# Patient Record
Sex: Female | Born: 1935 | Race: White | Hispanic: No | Marital: Married | Smoking: Former smoker
Health system: Southern US, Community
[De-identification: ages and names within clinical notes are randomized; demographics above are authoritative.]

## PROBLEM LIST (undated history)

## (undated) DIAGNOSIS — I10 Essential (primary) hypertension: Secondary | ICD-10-CM

## (undated) HISTORY — PX: TONSILLECTOMY: SUR1361

## (undated) HISTORY — PX: APPENDECTOMY: SHX54

## (undated) HISTORY — PX: KNEE SURGERY: SHX244

---

## 2000-08-20 ENCOUNTER — Encounter: Payer: Self-pay | Admitting: Family Medicine

## 2000-08-20 ENCOUNTER — Encounter: Admission: RE | Admit: 2000-08-20 | Discharge: 2000-08-20 | Payer: Self-pay | Admitting: Family Medicine

## 2000-08-29 ENCOUNTER — Encounter: Admission: RE | Admit: 2000-08-29 | Discharge: 2000-08-29 | Payer: Self-pay | Admitting: Family Medicine

## 2000-08-29 ENCOUNTER — Encounter: Payer: Self-pay | Admitting: Family Medicine

## 2001-07-01 ENCOUNTER — Emergency Department (HOSPITAL_COMMUNITY): Admission: EM | Admit: 2001-07-01 | Discharge: 2001-07-01 | Payer: Self-pay | Admitting: Emergency Medicine

## 2002-02-23 ENCOUNTER — Ambulatory Visit (HOSPITAL_COMMUNITY): Admission: RE | Admit: 2002-02-23 | Discharge: 2002-02-23 | Payer: Self-pay | Admitting: Cardiology

## 2004-08-25 ENCOUNTER — Ambulatory Visit (HOSPITAL_COMMUNITY): Admission: RE | Admit: 2004-08-25 | Discharge: 2004-08-25 | Payer: Self-pay | Admitting: Family Medicine

## 2005-09-25 ENCOUNTER — Emergency Department (HOSPITAL_COMMUNITY): Admission: EM | Admit: 2005-09-25 | Discharge: 2005-09-25 | Payer: Self-pay | Admitting: Emergency Medicine

## 2010-06-13 ENCOUNTER — Encounter (HOSPITAL_COMMUNITY): Payer: Self-pay

## 2010-06-14 ENCOUNTER — Other Ambulatory Visit: Payer: Self-pay | Admitting: Family Medicine

## 2010-06-14 ENCOUNTER — Ambulatory Visit (HOSPITAL_COMMUNITY): Payer: Medicare Other | Attending: Family Medicine

## 2010-06-14 DIAGNOSIS — D649 Anemia, unspecified: Secondary | ICD-10-CM | POA: Insufficient documentation

## 2010-06-15 LAB — CROSSMATCH: Unit division: 0

## 2011-09-13 ENCOUNTER — Ambulatory Visit: Payer: Medicare Other | Attending: Orthopedic Surgery | Admitting: Physical Therapy

## 2011-09-13 DIAGNOSIS — M25619 Stiffness of unspecified shoulder, not elsewhere classified: Secondary | ICD-10-CM | POA: Insufficient documentation

## 2011-09-13 DIAGNOSIS — IMO0001 Reserved for inherently not codable concepts without codable children: Secondary | ICD-10-CM | POA: Insufficient documentation

## 2011-09-13 DIAGNOSIS — M25519 Pain in unspecified shoulder: Secondary | ICD-10-CM | POA: Insufficient documentation

## 2011-09-18 ENCOUNTER — Ambulatory Visit: Payer: Medicare Other | Admitting: Physical Therapy

## 2011-09-20 ENCOUNTER — Ambulatory Visit: Payer: Medicare Other | Admitting: Physical Therapy

## 2011-09-25 ENCOUNTER — Ambulatory Visit: Payer: Medicare Other | Admitting: Physical Therapy

## 2011-09-27 ENCOUNTER — Ambulatory Visit: Payer: Medicare Other | Admitting: Physical Therapy

## 2011-10-02 ENCOUNTER — Encounter: Payer: Medicare Other | Admitting: Physical Therapy

## 2011-10-04 ENCOUNTER — Encounter: Payer: Medicare Other | Admitting: Physical Therapy

## 2011-10-09 ENCOUNTER — Encounter: Payer: Medicare Other | Admitting: Physical Therapy

## 2011-10-11 ENCOUNTER — Encounter: Payer: Medicare Other | Admitting: Physical Therapy

## 2011-11-22 ENCOUNTER — Observation Stay (HOSPITAL_COMMUNITY)
Admission: EM | Admit: 2011-11-22 | Discharge: 2011-11-23 | Disposition: A | Discharge status: Home / Self Care | Payer: Medicare Other | Attending: Internal Medicine | Admitting: Internal Medicine

## 2011-11-22 ENCOUNTER — Emergency Department (HOSPITAL_COMMUNITY): Payer: Medicare Other

## 2011-11-22 ENCOUNTER — Encounter (HOSPITAL_COMMUNITY): Payer: Self-pay | Admitting: *Deleted

## 2011-11-22 DIAGNOSIS — K5289 Other specified noninfective gastroenteritis and colitis: Principal | ICD-10-CM | POA: Insufficient documentation

## 2011-11-22 DIAGNOSIS — K529 Noninfective gastroenteritis and colitis, unspecified: Secondary | ICD-10-CM

## 2011-11-22 DIAGNOSIS — I10 Essential (primary) hypertension: Secondary | ICD-10-CM

## 2011-11-22 DIAGNOSIS — K922 Gastrointestinal hemorrhage, unspecified: Secondary | ICD-10-CM

## 2011-11-22 DIAGNOSIS — R109 Unspecified abdominal pain: Secondary | ICD-10-CM

## 2011-11-22 HISTORY — DX: Essential (primary) hypertension: I10

## 2011-11-22 LAB — BASIC METABOLIC PANEL
BUN: 17 mg/dL (ref 6–23)
CO2: 22 mEq/L (ref 19–32)
Chloride: 102 mEq/L (ref 96–112)
Glucose, Bld: 129 mg/dL — ABNORMAL HIGH (ref 70–99)
Potassium: 4.4 mEq/L (ref 3.5–5.1)
Sodium: 135 mEq/L (ref 135–145)

## 2011-11-22 LAB — CBC
HCT: 35.5 % — ABNORMAL LOW (ref 36.0–46.0)
Hemoglobin: 12 g/dL (ref 12.0–15.0)
MCHC: 33.8 g/dL (ref 30.0–36.0)
RBC: 4.07 MIL/uL (ref 3.87–5.11)
WBC: 11.9 10*3/uL — ABNORMAL HIGH (ref 4.0–10.5)

## 2011-11-22 LAB — CBC WITH DIFFERENTIAL/PLATELET
Eosinophils Absolute: 0.1 10*3/uL (ref 0.0–0.7)
Hemoglobin: 11.8 g/dL — ABNORMAL LOW (ref 12.0–15.0)
Lymphocytes Relative: 11 % — ABNORMAL LOW (ref 12–46)
Lymphs Abs: 1.1 10*3/uL (ref 0.7–4.0)
MCH: 29.5 pg (ref 26.0–34.0)
MCV: 88.3 fL (ref 78.0–100.0)
Monocytes Relative: 8 % (ref 3–12)
Neutrophils Relative %: 81 % — ABNORMAL HIGH (ref 43–77)
Platelets: 305 10*3/uL (ref 150–400)
RBC: 4 MIL/uL (ref 3.87–5.11)
WBC: 10.5 10*3/uL (ref 4.0–10.5)

## 2011-11-22 LAB — OCCULT BLOOD, POC DEVICE: Fecal Occult Bld: POSITIVE

## 2011-11-22 LAB — PROTIME-INR: INR: 1.1 (ref 0.00–1.49)

## 2011-11-22 LAB — APTT: aPTT: 29 seconds (ref 24–37)

## 2011-11-22 LAB — CREATININE, SERUM
Creatinine, Ser: 0.45 mg/dL — ABNORMAL LOW (ref 0.50–1.10)
GFR calc Af Amer: >90 mL/min (ref >90)
GFR calc non Af Amer: 88 mL/min — ABNORMAL LOW (ref >90)

## 2011-11-22 MED ORDER — AMLODIPINE BESYLATE 10 MG PO TABS
10.0000 mg | ORAL_TABLET | Freq: Every day | ORAL | Status: DC
  Administered 2011-11-23: 10 mg via ORAL
  Filled 2011-11-22: qty 1

## 2011-11-22 MED ORDER — AMLODIPINE-OLMESARTAN 10-40 MG PO TABS: 1.0000 | ORAL_TABLET | Freq: Every day | ORAL | Status: DC

## 2011-11-22 MED ORDER — FAMOTIDINE 10 MG PO TABS
10.0000 mg | ORAL_TABLET | Freq: Two times a day (BID) | ORAL | Status: DC
  Administered 2011-11-22 – 2011-11-23 (×2): 10 mg via ORAL
  Filled 2011-11-22 (×3): qty 1

## 2011-11-22 MED ORDER — ONDANSETRON HCL 4 MG/2ML IJ SOLN: 4.0000 mg | Freq: Four times a day (QID) | INTRAMUSCULAR | Status: DC | PRN

## 2011-11-22 MED ORDER — IOHEXOL 300 MG/ML  SOLN
80.0000 mL | Freq: Once | INTRAMUSCULAR | Status: AC | PRN
  Administered 2011-11-22: 80 mL via INTRAVENOUS

## 2011-11-22 MED ORDER — ACETAMINOPHEN 325 MG PO TABS: 650.0000 mg | ORAL_TABLET | Freq: Four times a day (QID) | ORAL | Status: DC | PRN

## 2011-11-22 MED ORDER — OXYCODONE HCL 5 MG PO TABS: 5.0000 mg | ORAL_TABLET | ORAL | Status: DC | PRN

## 2011-11-22 MED ORDER — IOHEXOL 300 MG/ML  SOLN
20.0000 mL | INTRAMUSCULAR | Status: AC
  Administered 2011-11-22 (×2): 20 mL via ORAL

## 2011-11-22 MED ORDER — LORATADINE 10 MG PO TABS
10.0000 mg | ORAL_TABLET | Freq: Every day | ORAL | Status: DC | PRN
  Filled 2011-11-22: qty 1

## 2011-11-22 MED ORDER — VITAMIN D3 25 MCG (1000 UNIT) PO TABS
1000.0000 [IU] | ORAL_TABLET | Freq: Every day | ORAL | Status: DC
  Administered 2011-11-23: 1000 [IU] via ORAL
  Filled 2011-11-22: qty 1

## 2011-11-22 MED ORDER — ACETAMINOPHEN 650 MG RE SUPP: 650.0000 mg | Freq: Four times a day (QID) | RECTAL | Status: DC | PRN

## 2011-11-22 MED ORDER — MORPHINE SULFATE 2 MG/ML IJ SOLN: 1.0000 mg | INTRAMUSCULAR | Status: DC | PRN

## 2011-11-22 MED ORDER — LORAZEPAM 0.5 MG PO TABS
0.5000 mg | ORAL_TABLET | Freq: Every day | ORAL | Status: DC
  Administered 2011-11-22: 0.5 mg via ORAL
  Filled 2011-11-22: qty 1

## 2011-11-22 MED ORDER — IRBESARTAN 300 MG PO TABS
300.0000 mg | ORAL_TABLET | Freq: Every day | ORAL | Status: DC
Start: 2011-11-23 — End: 2011-11-23
  Administered 2011-11-23: 300 mg via ORAL
  Filled 2011-11-22: qty 1

## 2011-11-22 MED ORDER — ENOXAPARIN SODIUM 40 MG/0.4ML ~~LOC~~ SOLN
40.0000 mg | SUBCUTANEOUS | Status: DC
  Administered 2011-11-22: 40 mg via SUBCUTANEOUS
  Filled 2011-11-22 (×2): qty 0.4

## 2011-11-22 MED ORDER — SODIUM CHLORIDE 0.9 % IV SOLN
INTRAVENOUS | Status: DC
  Administered 2011-11-22: 22:00:00 via INTRAVENOUS

## 2011-11-22 MED ORDER — ONDANSETRON HCL 4 MG PO TABS: 4.0000 mg | ORAL_TABLET | Freq: Four times a day (QID) | ORAL | Status: DC | PRN

## 2011-11-22 NOTE — ED Provider Notes (Signed)
History     CSN: [id number]  Arrival date & time 11/22/11  4098   First MD Initiated Contact with Patient 11/22/11 1003      Chief Complaint  Patient presents with  . Rectal Bleeding  . Abdominal Pain    (Consider location/radiation/quality/duration/timing/severity/associated sxs/prior treatment) HPI Comments: Kimberly Quinn presents ambulatory with her husband for evaluation.  She states she developed lower abdominal cramping and had several loose stools yesterday morning and afternoon while returning home from [facility].  Her husband denies abdominal discomfort, nausea, vomiting, and diarrhea.  They have eaten in the same places and shared the same meals while away on vacation.  She had several small loose BMs in the early evening also and noticed some bright red blood in the toilet and on the tissue.  Today she felt fine initially but then developed cramping and had 3 BMs that were frankly bloody.  She takes an 81 mg aspirin every 2 days.  She denies taking any other anticoagulants.    Patient is a 78 y.o. female presenting with hematochezia and abdominal pain. The history is provided by the patient. No language interpreter was used.  Rectal Bleeding  The current episode started yesterday. The problem occurs occasionally. The problem has been gradually worsening. The pain is moderate. The stool is described as liquid, bloody and mixed with blood. There was no prior unsuccessful therapy. Associated symptoms include abdominal pain, diarrhea and nausea. Pertinent negatives include no anorexia, no fever, no hematemesis, no hemorrhoids, no rectal pain, no vomiting, no hematuria, no vaginal bleeding, no vaginal discharge, no chest pain, no headaches, no coughing, no difficulty breathing and no rash. She has been behaving normally. She has been eating and drinking normally. Urine output has been normal. The last void occurred less than 6 hours ago. Her past medical history does not include abdominal  surgery, developmental delay, inflammatory bowel disease, recent abdominal injury, recent antibiotic use, recent change in diet or a recent illness. Past medical history comments: diverticulosis. There were no sick contacts. Recently, medical care has been given by the PCP (treated for a UTI with cipro (was on bactrim but developed arash and the med was stopped)).  Abdominal Pain The primary symptoms of the illness include abdominal pain, fatigue, nausea, diarrhea and hematochezia. The primary symptoms of the illness do not include fever, shortness of breath, vomiting, hematemesis, vaginal discharge or vaginal bleeding.  Symptoms associated with the illness do not include chills, anorexia, diaphoresis, constipation or hematuria. Significant associated medical issues do not include inflammatory bowel disease.    Past Medical History  Diagnosis Date  . Hypertension     History reviewed. No pertinent past surgical history.  History reviewed. No pertinent family history.  History  Substance Use Topics  . Smoking status: Former Smoker    Quit date: 01/01/1958  . Smokeless tobacco: Not on file  . Alcohol Use: No    OB History    Grav Para Term Preterm Abortions TAB SAB Ect Mult Living                  Review of Systems  Constitutional: Positive for fatigue. Negative for fever, chills, diaphoresis, activity change and appetite change.  HENT: Negative.   Eyes: Negative.   Respiratory: Negative for cough, chest tightness, shortness of breath and wheezing.   Cardiovascular: Negative for chest pain.  Gastrointestinal: Positive for nausea, abdominal pain, diarrhea, blood in stool, hematochezia and anal bleeding. Negative for vomiting, constipation, abdominal distention, rectal pain,  anorexia, hematemesis and hemorrhoids.  Genitourinary: Negative for hematuria, vaginal bleeding and vaginal discharge.  Musculoskeletal: Negative.   Skin: Negative for pallor and rash.  Neurological: Positive  for light-headedness. Negative for dizziness, seizures, syncope, facial asymmetry, weakness and headaches.  Psychiatric/Behavioral: Negative.     Allergies  Sulfamethoxazole-trimethoprim  Home Medications   Current Outpatient Rx  Name  Route  Sig  Dispense  Refill  . AMLODIPINE-OLMESARTAN 10-40 MG PO TABS   Oral   Take 1 tablet by mouth daily.         . ASPIRIN EC 81 MG PO TBEC   Oral   Take 81 mg by mouth every other day.         Marland Kitchen VITAMIN D 1000 UNITS PO TABS   Oral   Take 1,000 Units by mouth daily.         Marland Kitchen CIPROFLOXACIN HCL 500 MG PO TABS   Oral   Take 500 mg by mouth 2 (two) times daily. Starting 11/16/11 for 3 days         . LORATADINE 10 MG PO TABS   Oral   Take 10 mg by mouth daily as needed. For allergies         . LORAZEPAM 0.5 MG PO TABS   Oral   Take 0.5 mg by mouth at bedtime.         Marland Kitchen FISH OIL 1200 MG PO CAPS   Oral   Take 1,200 mg by mouth 2 (two) times daily.         Marland Kitchen RANITIDINE HCL 150 MG PO CAPS   Oral   Take 150 mg by mouth 2 (two) times daily.         . SULFAMETHOXAZOLE-TRIMETHOPRIM 400-80 MG PO TABS   Oral   Take 1 tablet by mouth 2 (two) times daily. Starting 11/13/11 for 3 days           BP 109/70  Pulse 75  Temp 97.9 F (36.6 C) (Oral)  Resp 18  SpO2 98%  Physical Exam  Nursing note and vitals reviewed. Constitutional: She appears well-developed and well-nourished. No distress.  HENT:  Head: Normocephalic.  Right Ear: External ear normal.  Left Ear: External ear normal.  Nose: Nose normal.  Mouth/Throat: Oropharynx is clear and moist. No oropharyngeal exudate.  Eyes: Conjunctivae normal are normal. Pupils are equal, round, and reactive to light. Right eye exhibits no discharge. Left eye exhibits no discharge. No scleral icterus.  Neck: Normal range of motion. Neck supple. No JVD present. No tracheal deviation present.  Cardiovascular: Normal rate, regular rhythm, normal heart sounds and intact distal  pulses.  Exam reveals no gallop and no friction rub.   No murmur heard. Pulmonary/Chest: Effort normal and breath sounds normal. No stridor. No respiratory distress. She has no wheezes. She has no rales. She exhibits no tenderness.  Abdominal: Soft. Normal appearance. She exhibits no shifting dullness, no distension, no pulsatile liver, no abdominal bruit, no ascites, no pulsatile midline mass and no mass. Bowel sounds are increased. There is no hepatosplenomegaly. There is generalized tenderness (mostly lower abd without localizing). There is no rigidity, no rebound, no guarding, no CVA tenderness, no tenderness at McBurney's point and negative Murphy's sign. No hernia.  Musculoskeletal: Normal range of motion. She exhibits no edema and no tenderness.  Lymphadenopathy:    She has no cervical adenopathy.  Neurological: She is alert. No cranial nerve deficit.  Skin: Skin is warm and dry. No rash noted. No  erythema. No pallor.  Psychiatric: She has a normal mood and affect. Her behavior is normal.    ED Course  Procedures (including critical care time)  Labs Reviewed  CBC WITH DIFFERENTIAL - Abnormal; Notable for the following:    Hemoglobin 11.8 (*)     HCT 35.3 (*)     Neutrophils Relative 81 (*)     Neutro Abs 8.5 (*)     Lymphocytes Relative 11 (*)     All other components within normal limits  BASIC METABOLIC PANEL - Abnormal; Notable for the following:    Glucose, Bld 129 (*)     GFR calc non Af Amer 85 (*)     All other components within normal limits  PROTIME-INR  APTT  OCCULT BLOOD, POC DEVICE   No results found.   No diagnosis found.    MDM  Pt presents for evaluation of abdominal cramping and bloody stools.  She currently appears comfortable, NAD.  Note mild lower abd tenderness on exam without evidence of peritonitis.  Noted gross blood with occult blood positive stool.  No obvious fissures, rectal tenderness, or bleeding hemorrhoids encountered.  Will obtain basic  labs, coags, and a CT scan of the abdomen.  If she remains stable and no process is encountered on the imaging that would necessitate an acute surgical intervention, will contact her gastroenterologist for further evaluation.  CT scan demonstrates acute colitis.  With the GI bleeding, abdominal cramping, and colitis, I am concerned this is a hemorrhagic colitis.  She has been taking bactrim and ciprofloxacin.  Plan admit for further mgmnt.  Will discuss options for antibiotic coverage with the admitting provider.      Tobin Chad, MD 11/22/11 1736

## 2011-11-22 NOTE — ED Notes (Signed)
Received pt from ED to CDU 3.  Pt ambulatory to bathroom on arrival - voided and tolerated well.  Pt reports intermittent lower, bilateral abdominal pain (denies any at present) and 3 episodes of rectal bleeding.  Pt denies any rectal pain, reports last episode was bright red blood.  Pt denies any nausea or vomiting.  Pt is alert, oriented.  Pt had episode of tachycardia during evaluation by RN with heart rate increasing to 150 x 10 seconds, returning to NSR in 70's.  Pt was asymptomatic.

## 2011-11-22 NOTE — ED Provider Notes (Signed)
2:36 PM Patient moved to CDU holding for CT abd/pelvis, GI consult vs admission.  Sign out received from Dr Lorenso Courier.  Pt with hx diverticulosis p/w BRBPR and crampy lower abdominal pain.  Pt is mildly anemic (Hgb 11.8).  Plan is for CT abd/pelvis, r/o diverticulitis.  If CT is negative, pt will need to be seen by GI in the ED (her GI doctor is Buccini).  If she has any further bleeding, an H&H is to be drawn.  Pt may need to be admitted to medicine for observation given results or any further bleeding. Nurse Tana Coast witnessed an approximate 10 second episode of tachycardia to the 150s.  Pt was completely asymptomatic at the time.  Rhythm strip printed and discussed with Dr Lorenso Courier.  If pt repeats this, will attempt to get EKG.  Pt is is normal sinus now, remains without chest pain or SOB.  Pt reports she has had no bleeding since early this morning.  States she continues to have some pressure in her lower abdomen.  Pt is A&O, NAD, RRR, CTAB, abd soft, very mild tenderness across lower abdomen.   Plan:  CT, repeat H&H if further bleeding, EKG if repeat tachycardia, GI consult, possible admission pending rest of workup and patient reassessment.    3:05 PM Patient signed out to Felicie Morn, NP, who assumes care of patient at change of shift.  Pt is currently in CT.    Filed Vitals:   11/22/11 1411  BP: 177/65  Pulse: 83  Temp: 97.5 F (36.4 C)  Resp: 17    Results for orders placed during the hospital encounter of 11/22/11  CBC WITH DIFFERENTIAL      Component Value Range   WBC 10.5  4.0 - 10.5 K/uL   RBC 4.00  3.87 - 5.11 MIL/uL   Hemoglobin 11.8 (*) 12.0 - 15.0 g/dL   HCT 29.5 (*) 28.4 - 13.2 %   MCV 88.3  78.0 - 100.0 fL   MCH 29.5  26.0 - 34.0 pg   MCHC 33.4  30.0 - 36.0 g/dL   RDW 44.0  10.2 - 72.5 %   Platelets 305  150 - 400 K/uL   Neutrophils Relative 81 (*) 43 - 77 %   Neutro Abs 8.5 (*) 1.7 - 7.7 K/uL   Lymphocytes Relative 11 (*) 12 - 46 %   Lymphs Abs 1.1  0.7 - 4.0 K/uL   Monocytes Relative 8  3 - 12 %   Monocytes Absolute 0.8  0.1 - 1.0 K/uL   Eosinophils Relative 1  0 - 5 %   Eosinophils Absolute 0.1  0.0 - 0.7 K/uL   Basophils Relative 0  0 - 1 %   Basophils Absolute 0.0  0.0 - 0.1 K/uL  BASIC METABOLIC PANEL      Component Value Range   Sodium 135  135 - 145 mEq/L   Potassium 4.4  3.5 - 5.1 mEq/L   Chloride 102  96 - 112 mEq/L   CO2 22  19 - 32 mEq/L   Glucose, Bld 129 (*) 70 - 99 mg/dL   BUN 17  6 - 23 mg/dL   Creatinine, Ser 3.66  0.50 - 1.10 mg/dL   Calcium 44.0  8.4 - 34.7 mg/dL   GFR calc non Af Amer 85 (*) >90 mL/min   GFR calc Af Amer >90  >90 mL/min  PROTIME-INR      Component Value Range   Prothrombin Time 14.1  11.6 - 15.2  seconds   INR 1.10  0.00 - 1.49  APTT      Component Value Range   aPTT 29  24 - 37 seconds  OCCULT BLOOD, POC DEVICE      Component Value Range   Fecal Occult Bld POSITIVE     No results found.    [place], [place] 11/22/11 1507

## 2011-11-22 NOTE — H&P (Signed)
[facility]          History and Physical    PCP:   Lupita Raider, MD   Chief Complaint:  Abdominal pain and bloody diarrhea  HPI: 78 y/o woman with no PMH other than HTN, who presents to the ED with the above complaint at the urging of her GI, Dr. Matthias Hughs. She had a UTI 1 week ago and completed a 3 days course of cipro on Monday. Monday she and her husband went to [facility]. Yesterday, she developed some crampy abdominal pain and felt like she was going to pass out due to the severity of the pain. She then had diarrhea. She had another episode before leaving [facility] yesterday at that time she noticed a little blood when she wiped. This morning she had another episode of crampy abdominal pain and went to the bathroom, when she stood up from the toilet she noticed the water was red with all the blood. She became concerned and called Dr. Matthias Hughs who recommended she come to the ED for evaluation. She has had one more episode in the hospital. She is hemodynamically stable, Hb is 11.8. A CT scan shows colitis. EDP has discussed with Dr. Matthias Hughs who recommends admission for stabilization, IVF and pain control. He does not believe she needs antibiotics at this time. We have been asked to admit her for further evaluation and management.  Allergies:   Allergies  Allergen Reactions  . Sulfamethoxazole-Trimethoprim Rash      Past Medical History  Diagnosis Date  . Hypertension     History reviewed. No pertinent past surgical history.  Prior to Admission medications   Medication Sig Start Date End Date Taking? Authorizing Provider  amLODipine-olmesartan (AZOR) 10-40 MG per tablet Take 1 tablet by mouth daily.   Yes Historical Provider, MD  aspirin EC 81 MG tablet Take 81 mg by mouth every other day.   Yes Historical Provider, MD  cholecalciferol (VITAMIN D) 1000 UNITS tablet Take 1,000 Units by mouth daily.   Yes Historical Provider, MD  ciprofloxacin (CIPRO) 500 MG  tablet Take 500 mg by mouth 2 (two) times daily. Starting 11/16/11 for 3 days   Yes Historical Provider, MD  loratadine (CLARITIN) 10 MG tablet Take 10 mg by mouth daily as needed. For allergies   Yes Historical Provider, MD  LORazepam (ATIVAN) 0.5 MG tablet Take 0.5 mg by mouth at bedtime.   Yes Historical Provider, MD  Omega-3 Fatty Acids (FISH OIL) 1200 MG CAPS Take 1,200 mg by mouth 2 (two) times daily.   Yes Historical Provider, MD  ranitidine (ZANTAC) 150 MG capsule Take 150 mg by mouth 2 (two) times daily.   Yes Historical Provider, MD  sulfamethoxazole-trimethoprim (BACTRIM,SEPTRA) 400-80 MG per tablet Take 1 tablet by mouth 2 (two) times daily. Starting 11/13/11 for 3 days   Yes Historical Provider, MD    Social History:  reports that she quit smoking about 53 years ago. She does not have any smokeless tobacco history on file. She reports that she does not drink alcohol or use illicit drugs.  History reviewed. No pertinent family history.  Review of Systems:  Constitutional: Denies fever, chills, diaphoresis, appetite change and fatigue.  HEENT: Denies photophobia, eye pain, redness, hearing loss, ear pain, congestion, sore throat, rhinorrhea, sneezing, mouth sores, trouble swallowing, neck pain, neck stiffness and tinnitus.   Respiratory: Denies SOB, DOE, cough, chest tightness,  and wheezing.   Cardiovascular: Denies chest pain, palpitations and leg swelling.  Gastrointestinal: Denies nausea,  vomiting,  Constipation. Genitourinary: Denies dysuria, urgency, frequency, hematuria, flank pain and difficulty urinating.  Musculoskeletal: Denies myalgias, back pain, joint swelling, arthralgias and gait problem.  Skin: Denies pallor, rash and wound.  Neurological: Denies dizziness, seizures, syncope, weakness, light-headedness, numbness and headaches.  Hematological: Denies adenopathy. Easy bruising, personal or family bleeding history  Psychiatric/Behavioral: Denies suicidal ideation,  mood changes, confusion, nervousness, sleep disturbance and agitation   Physical Exam: Blood pressure 162/73, pulse 83, temperature 97.5 F (36.4 C), temperature source Rectal, resp. rate 16, SpO2 98.00%. Gen: AA Ox3, NAD HEENT: [place], AT, PERRL, EOMI Neck: supple, no JVD, no LAD, no bruits, no goiter. CV: RRR, no M/R/G Lungs: CTA B Abd: S, NT, ND, +BS, no masses or organomegaly noted. Ext: no C/C/E, +pedal pulses Neuro: grossly intact and non-focal. Skin: no rashes or skin breakdown identified.  Labs on Admission:  Results for orders placed during the hospital encounter of 11/22/11 (from the past 48 hour(s))  CBC WITH DIFFERENTIAL     Status: Abnormal   Collection Time   11/22/11  8:55 AM      Component Value Range Comment   WBC 10.5  4.0 - 10.5 K/uL    RBC 4.00  3.87 - 5.11 MIL/uL    Hemoglobin 11.8 (*) 12.0 - 15.0 g/dL    HCT 16.1 (*) 09.6 - 46.0 %    MCV 88.3  78.0 - 100.0 fL    MCH 29.5  26.0 - 34.0 pg    MCHC 33.4  30.0 - 36.0 g/dL    RDW 04.5  40.9 - 81.1 %    Platelets 305  150 - 400 K/uL    Neutrophils Relative 81 (*) 43 - 77 %    Neutro Abs 8.5 (*) 1.7 - 7.7 K/uL    Lymphocytes Relative 11 (*) 12 - 46 %    Lymphs Abs 1.1  0.7 - 4.0 K/uL    Monocytes Relative 8  3 - 12 %    Monocytes Absolute 0.8  0.1 - 1.0 K/uL    Eosinophils Relative 1  0 - 5 %    Eosinophils Absolute 0.1  0.0 - 0.7 K/uL    Basophils Relative 0  0 - 1 %    Basophils Absolute 0.0  0.0 - 0.1 K/uL   BASIC METABOLIC PANEL     Status: Abnormal   Collection Time   11/22/11  8:55 AM      Component Value Range Comment   Sodium 135  135 - 145 mEq/L    Potassium 4.4  3.5 - 5.1 mEq/L    Chloride 102  96 - 112 mEq/L    CO2 22  19 - 32 mEq/L    Glucose, Bld 129 (*) 70 - 99 mg/dL    BUN 17  6 - 23 mg/dL    Creatinine, Ser 9.14  0.50 - 1.10 mg/dL    Calcium 78.2  8.4 - 10.5 mg/dL    GFR calc non Af Amer 85 (*) >90 mL/min    GFR calc Af Amer >90  >90 mL/min   PROTIME-INR     Status: Normal   Collection  Time   11/22/11 11:28 AM      Component Value Range Comment   Prothrombin Time 14.1  11.6 - 15.2 seconds    INR 1.10  0.00 - 1.49   APTT     Status: Normal   Collection Time   11/22/11 11:28 AM      Component Value Range Comment  aPTT 29  24 - 37 seconds   OCCULT BLOOD, POC DEVICE     Status: Normal   Collection Time   11/22/11  1:29 PM      Component Value Range Comment   Fecal Occult Bld POSITIVE       Radiological Exams on Admission: Ct Abdomen Pelvis W Contrast  11/22/2011  *RADIOLOGY REPORT*  Clinical Data: 78 year old female with intermittent bilateral lower abdominal pain.  Episodes of rectal bleeding.  Bright red blood.  CT ABDOMEN AND PELVIS WITH CONTRAST  Technique:  Multidetector CT imaging of the abdomen and pelvis was performed following the standard protocol during bolus administration of intravenous contrast.  Contrast: 80mL OMNIPAQUE IOHEXOL 300 MG/ML  SOLN  Comparison: None.  Findings: Lung bases are clear.  Cardiomegaly.  No pleural pericardial effusion.  Multilevel degenerative changes in the spine.  Advanced chronic lumbar disc degeneration.  Osteopenia.  Multilevel mild lumbar spondylolisthesis. No acute osseous abnormality identified.  Trace pelvic free fluid.  Uterus and adnexa within normal limits. The rectum is within normal limits.  There is diverticulosis of the sigmoid colon, which appears mildly thick-walled and indistinct in the pelvis.  There is more pronounced proximal sigmoid and descending colon wall thickening.  The colon is abnormal from the splenic flexure to the pelvis with wall thickening up to 10 mm throughout this region.  Adjacent mesenteric inflammation is maximal just below the level of the left kidney.  The transverse colon is spared and within normal limits.  Oral contrast has reached the distal transverse colon.  The right colon and cecum are normal.  Appendix not identified.  Distal small bowel within normal limits.  No dilated small bowel.   Stomach and duodenum decompressed.  Multiple large circumscribed low density areas in the liver compatible with hepatic cysts.  The largest is 44 mm diameter in the left lobe.  Gallbladder surgically absent.  Spleen and adrenal glands within normal limits.  Atrophic pancreas.  Small calcified splenic artery aneurysm at the splenic hilum.  Kidneys within normal limits.  Portal venous system within normal limits. Extensive calcified atherosclerosis of the aorta and its branches. Major arterial structures in the abdomen pelvis are patent. No abdominal free fluid.  No pneumoperitoneum. Bladder is distended but otherwise unremarkable.  IMPRESSION: Long segment acute colitis from the splenic flexure to the sigmoid colon.  Associated mesenteric stranding with only trace pelvic free fluid.  No abscess or other complicating features. Diverticula in the sigmoid colon.   Original Report Authenticated By: Erskine Speed, M.D.     Assessment/Plan Principal Problem:  *Colitis Active Problems:  HTN (hypertension)  Abdominal pain   Abdominal Pain -CT scan consistent with colitis. -Wonder if c diff given she recently completed a course of antibiotics. -Check C diff PCR. -Dr. Matthias Hughs has recommended no antibiotics given she is afebrile, no leukocytosis. -Will admit for fluids, pain management and follow up of her Hb. -Can likely DC home in 1-2 days. -Dr. Matthias Hughs is available if needed for consultation.  HTN -Restart home meds.  DVT Prophylaxis -Lovenox.   Time Spent on Admission: 70 minutes  HERNANDEZ Quinn,Kimberly [facility] Pager: [phone] 11/22/2011, 6:23 PM

## 2011-11-22 NOTE — ED Notes (Addendum)
Pt in c/o generalized abd pain since yesterday, noted rectal bleeding last night, states she didn't have a bowel movement but noted blood on her bath tissue and states it was coming from her rectum, second episode of bleeding this am, describes as bright red. Abd pain with these episodes. Pt skin warm and dry, ambulatory to triage. Pt states she was treated last week for a UTI also.

## 2011-11-22 NOTE — ED Provider Notes (Signed)
Medical screening examination/treatment/procedure(s) were conducted as a shared visit with non-physician practitioner(s) and myself.  I personally evaluated the patient during the encounter  Tobin Chad, MD 11/22/11 1737

## 2011-11-22 NOTE — ED Notes (Signed)
Patient transported to CT on stretcher with transporter. 

## 2011-11-22 NOTE — ED Notes (Signed)
Pt return from ct

## 2011-11-22 NOTE — ED Provider Notes (Signed)
CT results reviewed and discussed with Dr. Lorenso Courier, shared with patient.  4:12 PM   Consulted with GI (Buccini)--feels medicine admit prudent for IV fluids/pain management/hemodynamic monitoring.  Antibiotics not recommended at present.  Available for consultation as needed.  Admission request to hospitalist service.  Jimmye Norman, NP 11/22/11 1815

## 2011-11-23 DIAGNOSIS — K922 Gastrointestinal hemorrhage, unspecified: Secondary | ICD-10-CM

## 2011-11-23 LAB — BASIC METABOLIC PANEL
BUN: 10 mg/dL (ref 6–23)
Chloride: 106 mEq/L (ref 96–112)
GFR calc Af Amer: >90 mL/min (ref >90)
GFR calc non Af Amer: 85 mL/min — ABNORMAL LOW (ref >90)
Potassium: 4 mEq/L (ref 3.5–5.1)

## 2011-11-23 LAB — CBC
Hemoglobin: 11.7 g/dL — ABNORMAL LOW (ref 12.0–15.0)
MCHC: 32.9 g/dL (ref 30.0–36.0)
RDW: 14 % (ref 11.5–15.5)
WBC: 11.1 10*3/uL — ABNORMAL HIGH (ref 4.0–10.5)

## 2011-11-23 MED ORDER — SACCHAROMYCES BOULARDII 250 MG PO CAPS: 250.0000 mg | ORAL_CAPSULE | Freq: Two times a day (BID) | ORAL | Status: DC

## 2011-11-23 MED ORDER — TRAMADOL HCL 50 MG PO TABS: 25.0000 mg | ORAL_TABLET | Freq: Four times a day (QID) | ORAL | Status: DC | PRN

## 2011-11-23 MED ORDER — SACCHAROMYCES BOULARDII 250 MG PO CAPS
250.0000 mg | ORAL_CAPSULE | Freq: Two times a day (BID) | ORAL | Status: DC
  Filled 2011-11-23 (×2): qty 1

## 2011-11-23 NOTE — Evaluation (Signed)
Physical Therapy Evaluation Patient Details Name: Kimberly Quinn MRN: [id number] DOB: 05-16-1923 Today's Date: 11/23/2011 Time: 2725-3664 PT Time Calculation (min): 8 min  PT Assessment / Plan / Recommendation Clinical Impression  Pt adm with colitis.  Pt unsteady earlier with nursing but improved and back to baseline once she had her shoes on.  Pt okay to return home with husband.    PT Assessment  Patent does not need any further PT services    Follow Up Recommendations  No PT follow up    Does the patient have the potential to tolerate intense rehabilitation      Barriers to Discharge        Equipment Recommendations  None recommended by PT    Recommendations for Other Services     Frequency      Precautions / Restrictions     Pertinent Vitals/Pain N/A      Mobility  Transfers Transfers: Sit to Stand;Stand to Sit Sit to Stand: 6: Modified independent (Device/Increase time);With upper extremity assist;From bed Stand to Sit: 6: Modified independent (Device/Increase time);With upper extremity assist;To bed Ambulation/Gait Ambulation/Gait Assistance: 6: Modified independent (Device/Increase time) Ambulation Distance (Feet): 150 Feet Assistive device: Rolling walker;None Ambulation/Gait Assistance Details: No assistive device in room.  Used walker in hallway. Gait Pattern: Step-through pattern;Decreased stride length;Trunk flexed    Shoulder Instructions     Exercises     PT Diagnosis:    PT Problem List:   PT Treatment Interventions:     PT Goals    Visit Information  Last PT Received On: 11/23/11 Assistance Needed: +1    Subjective Data  Subjective: Pt states she walks better in her shoes.  Pt now with shoes on. Patient Stated Goal: return home   Prior Functioning  Home Living Lives With: Spouse Available Help at Discharge: Family Home Adaptive Equipment: Walker - rolling;Straight cane Prior Function Level of Independence: Independent with  assistive device(s) (uses RW when out at times.) Able to Take Stairs?: Yes Vocation: Retired Musician: No difficulties Dominant Hand: Left    Cognition  Overall Cognitive Status: Appears within functional limits for tasks assessed/performed Arousal/Alertness: Awake/alert Orientation Level: Appears intact for tasks assessed Behavior During Session: [facility] for tasks performed    Extremity/Trunk Assessment Right Lower Extremity Assessment RLE ROM/Strength/Tone: [facility] for tasks assessed Left Lower Extremity Assessment LLE ROM/Strength/Tone: [facility] for tasks assessed   Balance Static Standing Balance Static Standing - Balance Support: No upper extremity supported;During functional activity Static Standing - Level of Assistance: 6: Modified independent (Device/Increase time)  End of Session PT - End of Session Activity Tolerance: Patient tolerated treatment well Patient left: in bed;with call bell/phone within reach;with family/visitor present Nurse Communication: Mobility status  GP Functional Assessment Tool Used: clinical judgement Functional Limitation: Mobility: Walking and moving around Mobility: Walking and Moving Around Current Status [phone]): 0 percent impaired, limited or restricted Mobility: Walking and Moving Around Goal Status [phone]): 0 percent impaired, limited or restricted Mobility: Walking and Moving Around Discharge Status [phone]): 0 percent impaired, limited or restricted   [facility] 11/23/2011, 10:50 AM  [facility] PT [phone]

## 2011-11-23 NOTE — ED Provider Notes (Signed)
Medical screening examination/treatment/procedure(s) were conducted as a shared visit with non-physician practitioner(s) and myself.  I personally evaluated the patient during the encounter  Tobin Chad, MD 11/23/11 1655

## 2011-11-23 NOTE — Care Management Note (Signed)
    Page 1 of 1   11/23/2011     3:49:29 PM   CARE MANAGEMENT NOTE 11/23/2011  Patient:  Kimberly Quinn, Kimberly Quinn   Account Number:  [id number]  Date Initiated:  11/23/2011  Documentation initiated by:  Letha Cape  Subjective/Objective Assessment:   dx colitis  admit- lives with spouse.     Action/Plan:   pt eval- rec no pt follow up.   Anticipated DC Date:  11/23/2011   Anticipated DC Plan:  HOME/SELF CARE      DC Planning Services  CM consult      Choice offered to / List presented to:             Status of service:  Completed, signed off Medicare Important Message given?   (If response is "NO", the following Medicare IM given date fields will be blank) Date Medicare IM given:   Date Additional Medicare IM given:    Discharge Disposition:  HOME/SELF CARE  Per UR Regulation:  Reviewed for med. necessity/level of care/duration of stay  If discussed at Long Length of Stay Meetings, dates discussed:    Comments:  11/23/11 15:48 Letha Cape RN, BSN [phone] patient lives with spouse, per physical therapy no pt needed.  Patient for dc today, No needs anticipated.

## 2011-11-23 NOTE — Progress Notes (Signed)
Pt. discharged to floor,verbalized understanding of discharged instruction,medication,restriction,diet and follow up appointment.Baseline Vitals sign stable,Pt comfortable,no sign and symptom of distress. 

## 2011-11-23 NOTE — Discharge Summary (Signed)
PATIENT DETAILS Name: Kimberly Quinn Age: 78 y.o. Sex: female Date of Birth: 1923-05-26 MRN: [id number]. Admit Date: 11/22/2011 Admitting Physician: Henderson Cloud, MD NWG:NFAO,[id number], MD  Recommendations for Outpatient Follow-up:  1. Follow up FOBT next visit  PRIMARY DISCHARGE DIAGNOSIS:  Principal Problem:  *Colitis Active Problems:  HTN (hypertension)  Abdominal pain      PAST MEDICAL HISTORY: Past Medical History  Diagnosis Date  . Hypertension     DISCHARGE MEDICATIONS:   Medication List     As of 11/23/2011  2:02 PM    STOP taking these medications         ciprofloxacin 500 MG tablet   Commonly known as: CIPRO      sulfamethoxazole-trimethoprim 400-80 MG per tablet   Commonly known as: BACTRIM,SEPTRA      TAKE these medications         aspirin EC 81 MG tablet   Take 81 mg by mouth every other day.      AZOR 10-40 MG per tablet   Generic drug: amLODipine-olmesartan   Take 1 tablet by mouth daily.      cholecalciferol 1000 UNITS tablet   Commonly known as: VITAMIN D   Take 1,000 Units by mouth daily.      Fish Oil 1200 MG Caps   Take 1,200 mg by mouth 2 (two) times daily.      loratadine 10 MG tablet   Commonly known as: CLARITIN   Take 10 mg by mouth daily as needed. For allergies      LORazepam 0.5 MG tablet   Commonly known as: ATIVAN   Take 0.5 mg by mouth at bedtime.      ranitidine 150 MG capsule   Commonly known as: ZANTAC   Take 150 mg by mouth 2 (two) times daily.      traMADol 50 MG tablet   Commonly known as: ULTRAM   Take 0.5 tablets (25 mg total) by mouth every 6 (six) hours as needed for pain.          BRIEF HPI:  See H&P, Labs, Consult and Test reports for all details in brief,78 y/o woman with no PMH other than HTN, who presents to the ED with Abdominal pain and bloody diarrhea.CT scan shows colitis. EDP has discussed with Dr. Matthias Hughs who recommends admission for stabilization, IVF and pain  control.  CONSULTATIONS:  Phone consultation with Dr Matthias Hughs  PERTINENT RADIOLOGIC STUDIES: Ct Abdomen Pelvis W Contrast  11/22/2011  *RADIOLOGY REPORT*  Clinical Data: 78 year old female with intermittent bilateral lower abdominal pain.  Episodes of rectal bleeding.  Bright red blood.  CT ABDOMEN AND PELVIS WITH CONTRAST  Technique:  Multidetector CT imaging of the abdomen and pelvis was performed following the standard protocol during bolus administration of intravenous contrast.  Contrast: 80mL OMNIPAQUE IOHEXOL 300 MG/ML  SOLN  Comparison: None.  Findings: Lung bases are clear.  Cardiomegaly.  No pleural pericardial effusion.  Multilevel degenerative changes in the spine.  Advanced chronic lumbar disc degeneration.  Osteopenia.  Multilevel mild lumbar spondylolisthesis. No acute osseous abnormality identified.  Trace pelvic free fluid.  Uterus and adnexa within normal limits. The rectum is within normal limits.  There is diverticulosis of the sigmoid colon, which appears mildly thick-walled and indistinct in the pelvis.  There is more pronounced proximal sigmoid and descending colon wall thickening.  The colon is abnormal from the splenic flexure to the pelvis with wall thickening up to 10 mm throughout this region.  Adjacent  mesenteric inflammation is maximal just below the level of the left kidney.  The transverse colon is spared and within normal limits.  Oral contrast has reached the distal transverse colon.  The right colon and cecum are normal.  Appendix not identified.  Distal small bowel within normal limits.  No dilated small bowel.  Stomach and duodenum decompressed.  Multiple large circumscribed low density areas in the liver compatible with hepatic cysts.  The largest is 44 mm diameter in the left lobe.  Gallbladder surgically absent.  Spleen and adrenal glands within normal limits.  Atrophic pancreas.  Small calcified splenic artery aneurysm at the splenic hilum.  Kidneys within normal  limits.  Portal venous system within normal limits. Extensive calcified atherosclerosis of the aorta and its branches. Major arterial structures in the abdomen pelvis are patent. No abdominal free fluid.  No pneumoperitoneum. Bladder is distended but otherwise unremarkable.  IMPRESSION: Long segment acute colitis from the splenic flexure to the sigmoid colon.  Associated mesenteric stranding with only trace pelvic free fluid.  No abscess or other complicating features. Diverticula in the sigmoid colon.   Original Report Authenticated By: Erskine Speed, M.D.      PERTINENT LAB RESULTS: CBC:  Basename 11/23/11 0643 11/22/11 1925  WBC 11.1* 11.9*  HGB 11.7* 12.0  HCT 35.6* 35.5*  PLT 280 308   CMET CMP     Component Value Date/Time   NA 139 11/23/2011 0643   K 4.0 11/23/2011 0643   CL 106 11/23/2011 0643   CO2 23 11/23/2011 0643   GLUCOSE 88 11/23/2011 0643   BUN 10 11/23/2011 0643   CREATININE 0.51 11/23/2011 0643   CALCIUM 9.7 11/23/2011 0643   GFRNONAA 85* 11/23/2011 0643   GFRAA >90 11/23/2011 0643    GFR Estimated Creatinine Clearance: 38.1 ml/min (by C-G formula based on Cr of 0.51). No results found for this basename: LIPASE:2,AMYLASE:2 in the last 72 hours No results found for this basename: CKTOTAL:3,CKMB:3,CKMBINDEX:3,TROPONINI:3 in the last 72 hours No components found with this basename: POCBNP:3 No results found for this basename: DDIMER:2 in the last 72 hours No results found for this basename: HGBA1C:2 in the last 72 hours No results found for this basename: CHOL:2,HDL:2,LDLCALC:2,TRIG:2,CHOLHDL:2,LDLDIRECT:2 in the last 72 hours No results found for this basename: TSH,T4TOTAL,FREET3,T3FREE,THYROIDAB in the last 72 hours No results found for this basename: VITAMINB12:2,FOLATE:2,FERRITIN:2,TIBC:2,IRON:2,RETICCTPCT:2 in the last 72 hours Coags:  Basename 11/22/11 1128  INR 1.10   Microbiology: No results found for this or any previous visit (from the past 240  hour(s)).   BRIEF HOSPITAL COURSE:   Principal Problem:  *Colitis -Likely ischemic colitis -Patient was admitted mostly for supportive care and pain control. She was placed on IV fluids and as needed narcotics. However since hospitalization she has had no pain at all. She did have some bloody stools prior to admission, but none since admission.Infact she has had no BM since admission. Her labs do not show any leukocytosis, her H/H is stable. On exam this am-she has no abdominal tenderness. She is being discharged today-as her symptoms have mostly resolved and she is now eating a regular diet. I did speak with Dr Matthias Hughs over the phone and he advises same as well. She will follow up with him in the next week or so.  Active Problems:  HTN (hypertension) -stable -resume prior home meds   Abdominal pain  -resolved   TODAY-DAY OF DISCHARGE:  Subjective:   Kimberly Quinn today has no headache,no chest abdominal pain,no new  weakness tingling or numbness, feels much better wants to go home today. She is in no pain whatsoever and has tolerated a heart healthy diet  Objective:   Blood pressure 148/66, pulse 82, temperature 98.4 F (36.9 C), temperature source Oral, resp. rate 16, height 5\' 1"  (1.549 m), weight 56.745 kg (125 lb 1.6 oz), SpO2 95.00%.  Intake/Output Summary (Last 24 hours) at 11/23/11 1402 Last data filed at 11/23/11 0610  Gross per 24 hour  Intake      0 ml  Output      2 ml  Net     -2 ml    Exam Awake Alert, Oriented *3, No new F.N deficits, Normal affect Sellers.AT,PERRAL Supple Neck,No JVD, No cervical lymphadenopathy appriciated.  Symmetrical Chest wall movement, Good air movement bilaterally, CTAB RRR,No Gallops,Rubs or new Murmurs, No Parasternal Heave +ve B.Sounds, Abd Soft, Non tender, No organomegaly appriciated, No rebound -guarding or rigidity. No Cyanosis, Clubbing or edema, No new Rash or bruise  DISCHARGE  CONDITION: Stable  DISPOSITION: HOME  DISCHARGE INSTRUCTIONS:    Activity:  As tolerated with Full fall precautions use walker/cane & assistance as needed  Diet recommendation: Heart Healthy diet Follow-up Information    Follow up with SHAW,[name], MD. Schedule an appointment as soon as possible for a visit in 2 weeks.   Contact information:   [address] [place] [address] [phone]       Follow up with Florencia Reasons, MD. Schedule an appointment as soon as possible for a visit in 1 week.   Contact information:   [address]                         [place] [address] [phone]         Total Time spent on discharge equals 45 minutes.  SignedJeoffrey Massed 11/23/2011 2:02 PM

## 2012-03-10 ENCOUNTER — Other Ambulatory Visit: Payer: Self-pay | Admitting: Orthopedic Surgery

## 2012-03-10 DIAGNOSIS — M545 Low back pain, unspecified: Secondary | ICD-10-CM

## 2012-03-12 ENCOUNTER — Ambulatory Visit
Admission: RE | Admit: 2012-03-12 | Discharge: 2012-03-12 | Disposition: A | Discharge status: Home / Self Care | Payer: Medicare Other | Source: Ambulatory Visit | Attending: Orthopedic Surgery | Admitting: Orthopedic Surgery

## 2012-03-12 DIAGNOSIS — M545 Low back pain, unspecified: Secondary | ICD-10-CM

## 2012-11-19 ENCOUNTER — Ambulatory Visit: Payer: Self-pay | Admitting: Podiatrist

## 2012-11-21 ENCOUNTER — Encounter: Payer: Self-pay | Admitting: Podiatrist

## 2012-11-21 ENCOUNTER — Ambulatory Visit (INDEPENDENT_AMBULATORY_CARE_PROVIDER_SITE_OTHER): Payer: Medicare Other | Admitting: Podiatrist

## 2012-11-21 VITALS — BP 147/57 | HR 76 | Resp 18

## 2012-11-21 DIAGNOSIS — Q828 Other specified congenital malformations of skin: Secondary | ICD-10-CM

## 2012-11-21 DIAGNOSIS — B351 Tinea unguium: Secondary | ICD-10-CM

## 2012-11-21 DIAGNOSIS — M79609 Pain in unspecified limb: Secondary | ICD-10-CM

## 2012-11-21 DIAGNOSIS — M216X9 Other acquired deformities of unspecified foot: Secondary | ICD-10-CM

## 2012-11-21 NOTE — Progress Notes (Signed)
HPI:  Patient presents today for follow up of foot and nail care. Denies any new complaints today.  Objective:  Patients chart is reviewed.  Neurovascular status unchanged.  Patients nails are thickened, discolored, distrophic, friable and brittle with yellow-brown discoloration. Patient subjectively relates they are painful with shoes and with ambulation of bilateral feet. Patient has painful Hyperkeratotic lesions present submet 1 right, 5th toe right, 2nd toe left, and submet 5 left-  All areas are painful and symptomatic.  Very thin fat pad also present to bilateral feet.    Assessment:  Symptomatic onychomycosis:  Painful hyperkeratotic lesions x 4  Plan:  Discussed treatment options and alternatives.  The symptomatic toenails were debrided through manual an mechanical means without complication.  Return appointment recommended at routine intervals of 3 months    Marlowe Aschoff, DPM

## 2012-11-21 NOTE — Patient Instructions (Signed)

## 2013-02-18 ENCOUNTER — Ambulatory Visit (INDEPENDENT_AMBULATORY_CARE_PROVIDER_SITE_OTHER): Payer: Medicare Other | Admitting: Podiatrist

## 2013-02-18 ENCOUNTER — Encounter: Payer: Self-pay | Admitting: Podiatrist

## 2013-02-18 VITALS — BP 140/60 | HR 84 | Resp 12

## 2013-02-18 DIAGNOSIS — B351 Tinea unguium: Secondary | ICD-10-CM

## 2013-02-18 DIAGNOSIS — M79609 Pain in unspecified limb: Secondary | ICD-10-CM

## 2013-02-18 DIAGNOSIS — L84 Corns and callosities: Secondary | ICD-10-CM

## 2013-02-18 NOTE — Progress Notes (Signed)
''   TOENAILS TRIM.''  HPI:  Patient presents today for follow up of foot and nail care. Denies any new complaints today.  Objective:  Patients chart is reviewed.  Vascular status reveals pedal pulses noted at 1 out of 4 dp and pt bilateral .  Neurological sensation is Normal to [facility] bilateral.  Patients nails are thickened, discolored, distrophic, friable and brittle with yellow-brown discoloration. Patient subjectively relates they are painful with shoes and with ambulation of bilateral feet.  hyperkaratotic lesion present submet 1 bilateral, left 2nd toe, right 5th toe.  Bilateral heels are thickened and callused as well, callus medial side of left hallux nail as well  Assessment:  Symptomatic onychomycosis, corns calluses x 4  Plan:  Discussed treatment options and alternatives.  The symptomatic toenails were debrided through manual an mechanical means without complication.  Debrided lesions x 4 without complication Return appointment recommended at routine intervals of 3 months    Marlowe AschoffKathryn Dois Juarbe, DPM

## 2013-05-21 ENCOUNTER — Encounter: Payer: Self-pay | Admitting: Podiatrist

## 2013-05-21 ENCOUNTER — Ambulatory Visit (INDEPENDENT_AMBULATORY_CARE_PROVIDER_SITE_OTHER): Payer: Medicare Other | Admitting: Podiatrist

## 2013-05-21 VITALS — BP 147/64 | HR 74 | Resp 15 | Ht 61.0 in | Wt 119.0 lb

## 2013-05-21 DIAGNOSIS — L84 Corns and callosities: Secondary | ICD-10-CM

## 2013-05-21 DIAGNOSIS — M79609 Pain in unspecified limb: Secondary | ICD-10-CM

## 2013-05-21 DIAGNOSIS — B351 Tinea unguium: Secondary | ICD-10-CM

## 2013-05-23 NOTE — Progress Notes (Signed)
''   TOENAILS TRIM.''  HPI: Patient presents today for follow up of foot and nail care. Denies any new complaints today.  Objective: Patients chart is reviewed. Vascular status reveals pedal pulses noted at 1 out of 4 dp and pt bilateral . Neurological sensation is Normal to [facility] monofilament bilateral. Patients nails are thickened, discolored, distrophic, friable and brittle with yellow-brown discoloration. Patient subjectively relates they are painful with shoes and with ambulation of bilateral feet. hyperkaratotic lesion present submet 1 bilateral, left 2nd toe, right 5th toe. Bilateral heels are thickened and callused as well, callus medial side of left hallux nail as well  Assessment: Symptomatic onychomycosis, corns calluses x 4  Plan: Discussed treatment options and alternatives. The symptomatic toenails were debrided through manual an mechanical means without complication. Debrided lesions x 4 without complication Return appointment recommended at routine intervals of 3 months

## 2013-08-27 ENCOUNTER — Telehealth: Payer: Self-pay | Admitting: *Deleted

## 2013-08-27 ENCOUNTER — Ambulatory Visit (INDEPENDENT_AMBULATORY_CARE_PROVIDER_SITE_OTHER): Payer: Medicare Other | Admitting: Podiatrist

## 2013-08-27 DIAGNOSIS — Q828 Other specified congenital malformations of skin: Secondary | ICD-10-CM

## 2013-08-27 DIAGNOSIS — M216X9 Other acquired deformities of unspecified foot: Secondary | ICD-10-CM

## 2013-08-27 DIAGNOSIS — B351 Tinea unguium: Secondary | ICD-10-CM

## 2013-08-27 DIAGNOSIS — M79609 Pain in unspecified limb: Secondary | ICD-10-CM

## 2013-08-27 DIAGNOSIS — M79673 Pain in unspecified foot: Secondary | ICD-10-CM

## 2013-08-27 NOTE — Telephone Encounter (Signed)
I was there today to see Dr. Irving Shows.  I noticed my updated medication list has two prescriptions on here that I'm not sure of and I don't know what they are.  I thought I'd call and tell you so you can take it off.  Call whenever it is convenient, thank you.

## 2013-08-30 NOTE — Progress Notes (Signed)
HPI: Patient presents today for follow up of foot and nail care. Denies any new complaints today.  Objective: Patients chart is reviewed. Vascular status reveals pedal pulses noted at 1 out of 4 dp and pt bilateral . Neurological sensation is Normal to [facility] monofilament bilateral. Patients nails are thickened, discolored, distrophic, friable and brittle with yellow-brown discoloration. Patient subjectively relates they are painful with shoes and with ambulation of bilateral feet. hyperkaratotic lesion present submet 1 bilateral, left 2nd toe, right 5th toe. Bilateral heels are thickened and callused as well, callus medial side of left hallux nail as well  Assessment: Symptomatic onychomycosis, corns calluses x 4  Plan: Discussed treatment options and alternatives. The symptomatic toenails were debrided through manual an mechanical means without complication. Debrided lesions x 4 without complication Return appointment recommended at routine intervals of 3 months

## 2013-08-31 NOTE — Telephone Encounter (Signed)
I returned her call.  I asked which medications she wanted take off.  She stated it's the last 2 on the list.  I told her I would remove the Ultram and the Saccharomyces Boulardi and Ultram.  I told her I would take care of it.

## 2013-11-05 ENCOUNTER — Ambulatory Visit (INDEPENDENT_AMBULATORY_CARE_PROVIDER_SITE_OTHER): Payer: Medicare Other | Admitting: Podiatrist

## 2013-11-05 ENCOUNTER — Ambulatory Visit: Payer: Medicare Other | Admitting: Podiatrist

## 2013-11-05 ENCOUNTER — Encounter: Payer: Self-pay | Admitting: Podiatrist

## 2013-11-05 DIAGNOSIS — Q828 Other specified congenital malformations of skin: Secondary | ICD-10-CM

## 2013-11-05 DIAGNOSIS — L84 Corns and callosities: Secondary | ICD-10-CM

## 2013-11-05 DIAGNOSIS — B351 Tinea unguium: Secondary | ICD-10-CM

## 2013-11-05 DIAGNOSIS — M216X9 Other acquired deformities of unspecified foot: Secondary | ICD-10-CM

## 2013-11-05 DIAGNOSIS — M79673 Pain in unspecified foot: Secondary | ICD-10-CM

## 2013-11-05 NOTE — Progress Notes (Signed)
HPI: Patient presents today for follow up of foot and nail care. Denies any new complaints today.  Objective: Patients chart is reviewed. Vascular status reveals pedal pulses noted at 1 out of 4 dp and pt bilateral . Neurological sensation is Normal to [facility] mon[facility]il[facility] bilateral. Patients nails are thickened, discolored, distrophic, friable and brittle with yellow-brown discoloration. Patient subjectively relates they are painful with shoes and with ambulation of bilateral feet. hyperkaratotic lesion present x 4- submet 1 bilateral, left 2nd toe, right 5th toe. Bilateral heels are thickened , callus medial side of left hallux nail as well  Assessment: Symptomatic onychomycosis, corns calluses x 4 Plan: Discussed treatment options and alternatives. The symptomatic toenails were debrided through manual an mechanical means without complication. Debrided lesions x 4 without complication Return appointment recommended at routine intervals of 3 months

## 2013-12-23 ENCOUNTER — Other Ambulatory Visit: Payer: Self-pay | Admitting: Family Medicine

## 2013-12-23 ENCOUNTER — Other Ambulatory Visit (HOSPITAL_COMMUNITY): Payer: Self-pay | Admitting: Family Medicine

## 2013-12-23 ENCOUNTER — Ambulatory Visit (HOSPITAL_COMMUNITY)
Admission: RE | Admit: 2013-12-23 | Discharge: 2013-12-23 | Disposition: A | Discharge status: Home / Self Care | Payer: Medicare Other | Source: Ambulatory Visit | Attending: Family Medicine | Admitting: Family Medicine

## 2013-12-23 DIAGNOSIS — R609 Edema, unspecified: Secondary | ICD-10-CM | POA: Diagnosis present

## 2013-12-23 DIAGNOSIS — M7989 Other specified soft tissue disorders: Secondary | ICD-10-CM

## 2013-12-23 NOTE — Progress Notes (Signed)
VASCULAR LAB PRELIMINARY  PRELIMINARY  PRELIMINARY  PRELIMINARY  Left lower extremity venous duplex completed.    Preliminary report: Left lower extremity is negative for deep and superficial vein thrombosis.  Attempted to call Dr. Clovis RileyMitchell, office closed.  Instructed patient to seek medical advice/treatment if symptoms persists or become worse.   Lou Loewe, Anniebell, RVT 12/23/2013, 5:25 PM

## 2013-12-30 ENCOUNTER — Observation Stay (HOSPITAL_COMMUNITY)
Admission: EM | Admit: 2013-12-30 | Discharge: 2013-12-31 | Disposition: A | Discharge status: Skilled Nursing Facility | Payer: Medicare Other | Attending: Internal Medicine | Admitting: Internal Medicine

## 2013-12-30 ENCOUNTER — Encounter (HOSPITAL_COMMUNITY): Payer: Self-pay | Admitting: *Deleted

## 2013-12-30 ENCOUNTER — Emergency Department (HOSPITAL_COMMUNITY): Payer: Medicare Other

## 2013-12-30 DIAGNOSIS — M4856XA Collapsed vertebra, not elsewhere classified, lumbar region, initial encounter for fracture: Secondary | ICD-10-CM | POA: Diagnosis not present

## 2013-12-30 DIAGNOSIS — D649 Anemia, unspecified: Secondary | ICD-10-CM | POA: Insufficient documentation

## 2013-12-30 DIAGNOSIS — D72829 Elevated white blood cell count, unspecified: Secondary | ICD-10-CM | POA: Diagnosis not present

## 2013-12-30 DIAGNOSIS — I10 Essential (primary) hypertension: Secondary | ICD-10-CM | POA: Diagnosis not present

## 2013-12-30 DIAGNOSIS — R911 Solitary pulmonary nodule: Secondary | ICD-10-CM | POA: Diagnosis not present

## 2013-12-30 DIAGNOSIS — M545 Low back pain, unspecified: Secondary | ICD-10-CM | POA: Diagnosis present

## 2013-12-30 DIAGNOSIS — Z87891 Personal history of nicotine dependence: Secondary | ICD-10-CM | POA: Diagnosis not present

## 2013-12-30 DIAGNOSIS — R0902 Hypoxemia: Secondary | ICD-10-CM

## 2013-12-30 DIAGNOSIS — M4806 Spinal stenosis, lumbar region: Secondary | ICD-10-CM | POA: Insufficient documentation

## 2013-12-30 DIAGNOSIS — E871 Hypo-osmolality and hyponatremia: Secondary | ICD-10-CM | POA: Insufficient documentation

## 2013-12-30 DIAGNOSIS — Z882 Allergy status to sulfonamides status: Secondary | ICD-10-CM | POA: Insufficient documentation

## 2013-12-30 DIAGNOSIS — G8929 Other chronic pain: Secondary | ICD-10-CM

## 2013-12-30 DIAGNOSIS — M549 Dorsalgia, unspecified: Secondary | ICD-10-CM

## 2013-12-30 LAB — URINALYSIS, ROUTINE W REFLEX MICROSCOPIC
Bilirubin Urine: NEGATIVE
GLUCOSE, UA: NEGATIVE mg/dL
HGB URINE DIPSTICK: NEGATIVE
KETONES UR: NEGATIVE mg/dL
LEUKOCYTES UA: NEGATIVE
Nitrite: NEGATIVE
PH: 5.5 (ref 5.0–8.0)
Protein, ur: NEGATIVE mg/dL
Specific Gravity, Urine: 1.013 (ref 1.005–1.030)
Urobilinogen, UA: 0.2 mg/dL (ref 0.0–1.0)

## 2013-12-30 LAB — CBC
HEMATOCRIT: 30.5 % — AB (ref 36.0–46.0)
HEMOGLOBIN: 10.1 g/dL — AB (ref 12.0–15.0)
MCH: 28.9 pg (ref 26.0–34.0)
MCHC: 33.1 g/dL (ref 30.0–36.0)
MCV: 87.1 fL (ref 78.0–100.0)
Platelets: 274 10*3/uL (ref 150–400)
RBC: 3.5 MIL/uL — ABNORMAL LOW (ref 3.87–5.11)
RDW: 13.6 % (ref 11.5–15.5)
WBC: 14 10*3/uL — AB (ref 4.0–10.5)

## 2013-12-30 LAB — BASIC METABOLIC PANEL
Anion gap: 8 (ref 5–15)
BUN: 24 mg/dL — AB (ref 6–23)
CHLORIDE: 97 meq/L (ref 96–112)
CO2: 20 mmol/L (ref 19–32)
Calcium: 9.2 mg/dL (ref 8.4–10.5)
Creatinine, Ser: 0.55 mg/dL (ref 0.50–1.10)
GFR calc Af Amer: >90 mL/min (ref >90)
GFR calc non Af Amer: 81 mL/min — ABNORMAL LOW (ref >90)
GLUCOSE: 108 mg/dL — AB (ref 70–99)
POTASSIUM: 5 mmol/L (ref 3.5–5.1)
Sodium: 125 mmol/L — ABNORMAL LOW (ref 135–145)

## 2013-12-30 MED ORDER — HYDROCODONE-ACETAMINOPHEN 5-325 MG PO TABS
2.0000 | ORAL_TABLET | Freq: Once | ORAL | Status: AC
  Administered 2013-12-30: 2 via ORAL
  Filled 2013-12-30: qty 2

## 2013-12-30 NOTE — ED Provider Notes (Signed)
CSN: [id number]     Arrival date & time 12/30/13  1722 History   First MD Initiated Contact with Patient 12/30/13 1725     Chief Complaint  Patient presents with  . Back Pain  . Urinary Retention     (Consider location/radiation/quality/duration/timing/severity/associated sxs/prior Treatment) Patient is a 80 y.o. female presenting with back pain. The history is provided by the patient.  Back Pain Location:  Lumbar spine Quality:  Aching Radiates to:  R posterior upper leg Pain severity:  Moderate Onset quality:  Gradual Duration:  4 days Timing:  Constant Progression:  Unchanged Chronicity:  New Context: not recent illness and not recent injury   Relieved by:  Narcotics (mild relief with hydrocodone) Worsened by:  Nothing tried Associated symptoms: no abdominal pain, no fever, no numbness and no paresthesias     Past Medical History  Diagnosis Date  . Hypertension    History reviewed. No pertinent past surgical history. History reviewed. No pertinent family history. History  Substance Use Topics  . Smoking status: Former Smoker    Quit date: 01/01/1958  . Smokeless tobacco: Never Used  . Alcohol Use: No   OB History    No data available     Review of Systems  Constitutional: Negative for fever.  Respiratory: Negative for cough and shortness of breath.   Gastrointestinal: Negative for vomiting and abdominal pain.  Musculoskeletal: Positive for back pain.  Neurological: Negative for numbness and paresthesias.  All other systems reviewed and are negative.     Allergies  Sulfamethoxazole-trimethoprim  Home Medications   Prior to Admission medications   Medication Sig Start Date End Date Taking? Authorizing Provider  amLODipine-olmesartan (AZOR) 10-40 MG per tablet Take 1 tablet by mouth daily.   Yes Historical Provider, MD  aspirin EC 81 MG tablet Take 81 mg by mouth every other day.   Yes Historical Provider, MD  cholecalciferol (VITAMIN D) 1000 UNITS  tablet Take 1,000 Units by mouth daily.   Yes Historical Provider, MD  CRANBERRY PO Take 1 tablet by mouth daily with lunch.   Yes Historical Provider, MD  HYDROcodone-acetaminophen (NORCO/VICODIN) 5-325 MG per tablet Take 1-2 tablets by mouth every 12 (twelve) hours as needed for moderate pain.   Yes Historical Provider, MD  ibuprofen (ADVIL,MOTRIN) 200 MG tablet Take 200 mg by mouth daily.   Yes Historical Provider, MD  loratadine (CLARITIN) 10 MG tablet Take 10 mg by mouth daily as needed for allergies. For allergies   Yes Historical Provider, MD  LORazepam (ATIVAN) 0.5 MG tablet Take 0.5 mg by mouth 2 (two) times daily.    Yes Historical Provider, MD  Omega-3 Fatty Acids (FISH OIL) 1200 MG CAPS Take 1,200 mg by mouth 2 (two) times daily.   Yes Historical Provider, MD  Propylene Glycol (SYSTANE BALANCE OP) Apply 1 drop to eye 4 (four) times daily.   Yes Historical Provider, MD  ranitidine (ZANTAC) 150 MG capsule Take 150 mg by mouth 2 (two) times daily.   Yes Historical Provider, MD   There were no vitals taken for this visit. Physical Exam  Constitutional: She is oriented to person, place, and time. She appears well-developed and well-nourished. No distress.  HENT:  Head: Normocephalic and atraumatic.  Mouth/Throat: Oropharynx is clear and moist.  Eyes: EOM are normal. Pupils are equal, round, and reactive to light.  Neck: Normal range of motion. Neck supple.  Cardiovascular: Normal rate and regular rhythm.  Exam reveals no friction rub.   No murmur heard.  Pulmonary/Chest: Effort normal and breath sounds normal. No respiratory distress. She has no wheezes. She has no rales.  Abdominal: Soft. She exhibits no distension. There is no tenderness. There is no rebound.  Musculoskeletal: Normal range of motion. She exhibits no edema.       Lumbar back: She exhibits normal range of motion, no tenderness and no bony tenderness.  Neurological: She is alert and oriented to person, place, and time.   Skin: No rash noted. She is not diaphoretic.  Nursing note and vitals reviewed.   ED Course  Procedures (including critical care time) Labs Review Labs Reviewed  URINALYSIS, ROUTINE W REFLEX MICROSCOPIC  CBC  BASIC METABOLIC PANEL    Imaging Review Mr Lumbar Spine Wo Contrast  12/30/2013   CLINICAL DATA:  Low back pain  EXAM: MRI LUMBAR SPINE WITHOUT CONTRAST  TECHNIQUE: Multiplanar, multisequence MR imaging of the lumbar spine was performed. No intravenous contrast was administered.  COMPARISON:  Lumbar MRI 03/12/2012  FINDINGS: Lumbar scoliosis. 3 cm hepatic cyst in the dome of the liver. Distended urinary bladder.  Conus medullaris is normal and terminates at L1-2. Chronic fracture L2. No acute fracture or mass.  T12-L1:  Mild disc and facet degeneration without stenosis  L1-2: Disc degeneration and spurring on the right with right foraminal encroachment. This was present previously. Central canal widely patent  L2-3: Disc and facet degeneration. Foraminal narrowing bilaterally due to spurring. Mild narrowing of the canal without significant spinal stenosis.  L3-4: 3 mm anterior slip. Advanced facet degeneration. Disc degeneration and spondylosis. Moderately severe spinal stenosis has progressed. Moderate foraminal encroachment bilaterally similar to the other study.  L4-5: 5 mm anterior slip. Severe facet degeneration and severe spinal stenosis. Subarticular and foraminal encroachment bilaterally.  L5-S1: Disc degeneration and spurring. Left-sided disc or osteophyte is unchanged from the prior study. Mild left foraminal narrowing is unchanged.  IMPRESSION: Chronic fracture L2.  No acute fracture.  Right foraminal encroachment due to spurring at L1-2 is unchanged.  Moderately severe spinal stenosis L3-4 with moderate foraminal encroachment bilaterally  5 mm anterior slip L4 -5 with severe spinal stenosis similar to the prior study. Subarticular and foraminal encroachment bilaterally.  L5-S1 left  foraminal encroachment due to disc and osteophyte. This is unchanged.   Electronically Signed   By: Marlan Palauharles  Clark M.D.   On: 12/30/2013 20:43     EKG Interpretation None      MDM   Final diagnoses:  Lower back pain  Back pain, chronic  Hyponatremia    80 year old female here for lower back pain. No recent trauma. History of arthritis and intermittent lower back pain. She's had mild relief with hydrocodone and NSAIDs. She is also complaining of some difficulty urinating. She says it feels like she has to go but only drips, out. She was recently treated with antibiotics, but symptoms started afterwards. So also reports having to use a propped upward see because she's having difficulty getting off the toilet for the past week. Denies any saddle anesthesia. Denies any stool incontinence her retention. Here vitals are stable. She has good 1+ reflexes bilaterally. She has difficulty rising legs for the reflex exam but after multiple prompts well. She is a difficult historian, she is with it andoriented however she is mildly tangential and doesn't fully follow all my questioning when trying to discern if she has possible cord compression. She does have history of osteoporotic fractures in the high lumbar spine, will plan for another MRI today to rule out possible  cord compression. MR ok. Unable to walk after pain meds, very weak. Labs show hyponatremia. Admitted for hyponatremia and difficulty with ambulation. Patient had good ROM of hips, doubt hip fracture, however will CT to look for possible occult bony pathology.  Elwin MochaBlair Marc Sivertsen, MD 12/31/13 [phone]

## 2013-12-30 NOTE — ED Notes (Signed)
Patient transported to MRI 

## 2013-12-30 NOTE — ED Notes (Signed)
Several unsuccessful attempts to ambulate patient.  She was able to stand with staff assistance and walker, but repeatedly said, "I just can't walk.  She reports she is unable to walk because she has been "lying in that bed too long."  Dr. Gwendolyn GrantWalden made aware.  Will attempt to ambulate after patient has sat on side of bed for a while.

## 2013-12-30 NOTE — ED Notes (Addendum)
Assisted patient with bedpan, large amount of urine expelled.

## 2013-12-30 NOTE — ED Notes (Addendum)
Dr. Gwendolyn GrantWalden at bedside.  He assisted patient to sitting position on side of bed.

## 2013-12-30 NOTE — ED Notes (Signed)
Patient recently treated for urinary tract infection. She has completed her course of antibiotics but still complains of urinary symptoms and low back pain that is getting progressively worse.

## 2013-12-30 NOTE — ED Notes (Signed)
Husband remains at bedside, makes staff aware of patient needs.

## 2013-12-30 NOTE — ED Notes (Signed)
Patient assisted to chair by staff member and husband, using walker.  After several minutes, patient is now sitting in chair.  She continues to complain of pain.  Dr. Gwendolyn GrantWalden made aware.

## 2013-12-30 NOTE — ED Notes (Signed)
Assisted patient back to lying position in the bed.  Encouraged to sit in chair since she complained that the bed was making her hurt more.

## 2013-12-30 NOTE — ED Notes (Signed)
Made patient aware that a urine sample is needed.

## 2013-12-31 ENCOUNTER — Encounter (HOSPITAL_COMMUNITY): Payer: Self-pay | Admitting: Internal Medicine

## 2013-12-31 ENCOUNTER — Inpatient Hospital Stay (HOSPITAL_COMMUNITY): Payer: Medicare Other

## 2013-12-31 DIAGNOSIS — M545 Low back pain, unspecified: Secondary | ICD-10-CM | POA: Diagnosis present

## 2013-12-31 DIAGNOSIS — R911 Solitary pulmonary nodule: Secondary | ICD-10-CM

## 2013-12-31 DIAGNOSIS — M5441 Lumbago with sciatica, right side: Secondary | ICD-10-CM

## 2013-12-31 DIAGNOSIS — M5440 Lumbago with sciatica, unspecified side: Secondary | ICD-10-CM

## 2013-12-31 DIAGNOSIS — I10 Essential (primary) hypertension: Secondary | ICD-10-CM

## 2013-12-31 DIAGNOSIS — E871 Hypo-osmolality and hyponatremia: Secondary | ICD-10-CM

## 2013-12-31 DIAGNOSIS — D649 Anemia, unspecified: Secondary | ICD-10-CM

## 2013-12-31 LAB — COMPREHENSIVE METABOLIC PANEL
ALBUMIN: 3.4 g/dL — AB (ref 3.5–5.2)
ALK PHOS: 71 U/L (ref 39–117)
ALT: 18 U/L (ref 0–35)
ANION GAP: 6 (ref 5–15)
AST: 25 U/L (ref 0–37)
BUN: 16 mg/dL (ref 6–23)
CO2: 24 mmol/L (ref 19–32)
CREATININE: 0.43 mg/dL — AB (ref 0.50–1.10)
Calcium: 9.6 mg/dL (ref 8.4–10.5)
Chloride: 102 mEq/L (ref 96–112)
GFR, EST NON AFRICAN AMERICAN: 88 mL/min — AB (ref >90)
GLUCOSE: 122 mg/dL — AB (ref 70–99)
Potassium: 4.6 mmol/L (ref 3.5–5.1)
Sodium: 132 mmol/L — ABNORMAL LOW (ref 135–145)
Total Bilirubin: 0.9 mg/dL (ref 0.3–1.2)
Total Protein: 6.8 g/dL (ref 6.0–8.3)

## 2013-12-31 LAB — IRON AND TIBC
Iron: 14 ug/dL — ABNORMAL LOW (ref 42–145)
Saturation Ratios: 5 % — ABNORMAL LOW (ref 20–55)
TIBC: 277 ug/dL (ref 250–470)
UIBC: 263 ug/dL (ref 125–400)

## 2013-12-31 LAB — CBC WITH DIFFERENTIAL/PLATELET
BASOS PCT: 0 % (ref 0–1)
Basophils Absolute: 0 10*3/uL (ref 0.0–0.1)
Eosinophils Absolute: 0.1 10*3/uL (ref 0.0–0.7)
Eosinophils Relative: 1 % (ref 0–5)
HCT: 34.5 % — ABNORMAL LOW (ref 36.0–46.0)
Hemoglobin: 11.3 g/dL — ABNORMAL LOW (ref 12.0–15.0)
Lymphocytes Relative: 4 % — ABNORMAL LOW (ref 12–46)
Lymphs Abs: 0.6 10*3/uL — ABNORMAL LOW (ref 0.7–4.0)
MCH: 28.5 pg (ref 26.0–34.0)
MCHC: 32.8 g/dL (ref 30.0–36.0)
MCV: 87.1 fL (ref 78.0–100.0)
MONO ABS: 1.4 10*3/uL — AB (ref 0.1–1.0)
Monocytes Relative: 10 % (ref 3–12)
NEUTROS PCT: 85 % — AB (ref 43–77)
Neutro Abs: 11.5 10*3/uL — ABNORMAL HIGH (ref 1.7–7.7)
PLATELETS: 321 10*3/uL (ref 150–400)
RBC: 3.96 MIL/uL (ref 3.87–5.11)
RDW: 13.5 % (ref 11.5–15.5)
WBC: 13.6 10*3/uL — AB (ref 4.0–10.5)

## 2013-12-31 LAB — OSMOLALITY, URINE: OSMOLALITY UR: 224 mosm/kg — AB (ref 390–1090)

## 2013-12-31 LAB — RETICULOCYTES
RBC.: 3.96 MIL/uL (ref 3.87–5.11)
RETIC CT PCT: 1.4 % (ref 0.4–3.1)
Retic Count, Absolute: 55.4 10*3/uL (ref 19.0–186.0)

## 2013-12-31 LAB — FOLATE: Folate: >20 ng/mL

## 2013-12-31 LAB — VITAMIN B12: VITAMIN B 12: 678 pg/mL (ref 211–911)

## 2013-12-31 LAB — SODIUM, URINE, RANDOM: Sodium, Ur: 31 mmol/L

## 2013-12-31 LAB — FERRITIN: Ferritin: 262 ng/mL (ref 10–291)

## 2013-12-31 MED ORDER — OMEGA-3-ACID ETHYL ESTERS 1 G PO CAPS
1.0000 g | ORAL_CAPSULE | Freq: Every day | ORAL | Status: DC
  Administered 2013-12-31: 1 g via ORAL
  Filled 2013-12-31: qty 1

## 2013-12-31 MED ORDER — ACETAMINOPHEN 325 MG PO TABS
650.0000 mg | ORAL_TABLET | Freq: Four times a day (QID) | ORAL | Status: DC | PRN
Start: 2013-12-31 — End: 2013-12-31

## 2013-12-31 MED ORDER — ONDANSETRON HCL 4 MG PO TABS: 4.0000 mg | ORAL_TABLET | Freq: Four times a day (QID) | ORAL | Status: DC | PRN

## 2013-12-31 MED ORDER — HYDROCODONE-ACETAMINOPHEN 5-325 MG PO TABS
1.0000 | ORAL_TABLET | Freq: Two times a day (BID) | ORAL | Status: DC | PRN
  Filled 2013-12-31: qty 1

## 2013-12-31 MED ORDER — ACETAMINOPHEN 650 MG RE SUPP: 650.0000 mg | Freq: Four times a day (QID) | RECTAL | Status: DC | PRN

## 2013-12-31 MED ORDER — MORPHINE SULFATE 2 MG/ML IJ SOLN: 1.0000 mg | INTRAMUSCULAR | Status: DC | PRN

## 2013-12-31 MED ORDER — IRBESARTAN 300 MG PO TABS
300.0000 mg | ORAL_TABLET | Freq: Every day | ORAL | Status: DC
  Administered 2013-12-31: 300 mg via ORAL
  Filled 2013-12-31: qty 1

## 2013-12-31 MED ORDER — AMLODIPINE BESYLATE 10 MG PO TABS
10.0000 mg | ORAL_TABLET | Freq: Every day | ORAL | Status: DC
  Administered 2013-12-31: 10 mg via ORAL
  Filled 2013-12-31: qty 1

## 2013-12-31 MED ORDER — HYDRALAZINE HCL 20 MG/ML IJ SOLN: 10.0000 mg | INTRAMUSCULAR | Status: DC | PRN

## 2013-12-31 MED ORDER — ONDANSETRON HCL 4 MG PO TABS: 4.0000 mg | ORAL_TABLET | Freq: Four times a day (QID) | ORAL | Status: AC | PRN

## 2013-12-31 MED ORDER — ACETAMINOPHEN 325 MG PO TABS: 650.0000 mg | ORAL_TABLET | Freq: Four times a day (QID) | ORAL | Status: AC | PRN

## 2013-12-31 MED ORDER — LORAZEPAM 0.5 MG PO TABS
0.5000 mg | ORAL_TABLET | Freq: Two times a day (BID) | ORAL | Status: DC
  Administered 2013-12-31: 0.5 mg via ORAL
  Filled 2013-12-31: qty 1

## 2013-12-31 MED ORDER — ONDANSETRON HCL 4 MG/2ML IJ SOLN: 4.0000 mg | Freq: Four times a day (QID) | INTRAMUSCULAR | Status: DC | PRN

## 2013-12-31 MED ORDER — HYDROCODONE-ACETAMINOPHEN 5-325 MG PO TABS: 1.0000 | ORAL_TABLET | Freq: Two times a day (BID) | ORAL | Status: AC | PRN

## 2013-12-31 MED ORDER — FAMOTIDINE 20 MG PO TABS
20.0000 mg | ORAL_TABLET | Freq: Two times a day (BID) | ORAL | Status: DC
  Administered 2013-12-31: 20 mg via ORAL
  Filled 2013-12-31 (×3): qty 1

## 2013-12-31 MED ORDER — SODIUM CHLORIDE 0.9 % IV BOLUS (SEPSIS)
500.0000 mL | Freq: Once | INTRAVENOUS | Status: AC
  Administered 2013-12-31: 500 mL via INTRAVENOUS

## 2013-12-31 MED ORDER — LIP MEDEX EX OINT
TOPICAL_OINTMENT | CUTANEOUS | Status: AC
  Administered 2013-12-31: 02:00:00
  Filled 2013-12-31: qty 7

## 2013-12-31 MED ORDER — LORATADINE 10 MG PO TABS
10.0000 mg | ORAL_TABLET | Freq: Every day | ORAL | Status: DC | PRN
  Filled 2013-12-31: qty 1

## 2013-12-31 MED ORDER — METHOCARBAMOL 500 MG PO TABS: 500.0000 mg | ORAL_TABLET | Freq: Three times a day (TID) | ORAL | Status: DC | PRN

## 2013-12-31 MED ORDER — AMLODIPINE-OLMESARTAN 10-40 MG PO TABS: 1.0000 | ORAL_TABLET | Freq: Every day | ORAL | Status: DC

## 2013-12-31 MED ORDER — SODIUM CHLORIDE 0.9 % IV SOLN
INTRAVENOUS | Status: DC
Start: 2013-12-31 — End: 2013-12-31
  Administered 2013-12-31: 06:00:00 via INTRAVENOUS

## 2013-12-31 NOTE — Care Management Note (Signed)
    Page 1 of 1   12/31/2013     1:39:54 PM CARE MANAGEMENT NOTE 12/31/2013  Patient:  Kimberly Quinn,Kimberly Quinn   Account Number:  [id number]  Date Initiated:  12/31/2013  Documentation initiated by:  Lorenda IshiharaPEELE,Naamah Boggess  Subjective/Objective Assessment:   80 yo female admitted with low back pain and hyponatremia.     Action/Plan:   Home vs SNF   Anticipated DC Date:  12/31/2013   Anticipated DC Plan:  SKILLED NURSING FACILITY  In-house referral  Clinical Social Worker      DC Planning Services  CM consult      Choice offered to / List presented to:             Status of service:  Completed, signed off Medicare Important Message given?   (If response is "NO", the following Medicare IM given date fields will be blank) Date Medicare IM given:   Medicare IM given by:   Date Additional Medicare IM given:   Additional Medicare IM given by:    Discharge Disposition:  SKILLED NURSING FACILITY  Per UR Regulation:  Reviewed for med. necessity/level of care/duration of stay  If discussed at Long Length of Stay Meetings, dates discussed:    Comments:

## 2013-12-31 NOTE — Progress Notes (Addendum)
Clinical Social Work Department CLINICAL SOCIAL WORK PLACEMENT NOTE 12/31/2013  Patient:  Kimberly GilmoreCOYLE,Brin C  Account Number:  [id number] Admit date:  12/30/2013  Clinical Social Worker:  Cori RazorJAMIE Tavis Kring, LCSW  Date/time:  12/31/2013 03:16 PM  Clinical Social Work is seeking post-discharge placement for this patient at the following level of care:   SKILLED NURSING   (*CSW will update this form in Epic as items are completed)   12/31/2013  Patient/family provided with [facility] of Clinical Social Work's list of facilities offering this level of care within the geographic area requested by the patient (or if unable, by the patient's family).  12/31/2013  Patient/family informed of their freedom to choose among providers that offer the needed level of care, that participate in Medicare, Medicaid or managed care program needed by the patient, have an available bed and are willing to accept the patient.    Patient/family informed of [facility]' ownership interest in [facility], as well as of the fact that they are under no obligation to receive care at this facility.  PASARR submitted to EDS on 12/31/2013 PASARR number received on 12/31/2013  FL2 transmitted to all facilities in geographic area requested by pt/family on  12/31/2013 FL2 transmitted to all facilities within larger geographic area on   Patient informed that his/her managed care company has contracts with or will negotiate with  certain facilities, including the following:     Patient/family informed of bed offers received:  12/31/2013 [facility] Physician recommends and patient chooses bed at    [facility]  12/31/2013 Patient to be transferred to facility by [facility] Patient and family notified of transfer on 12/31/2013 Name of family member notified:  SPOUSE  The following physician request were  entered in Epic:   Additional Comments: Pt / spouse are in agreement with d/c to SNF today. [facility] transport required. Pt / spouse aware of copayment's associated with transport even with authorization provided. NSG reviewed d/c summary, avs, scripts. Scripts included in d/c packet.  Cori RazorJamie Anju Sereno LCSW [phone]  16: 20 : [employer] has provided authorization for SNF placement. [facility] has been notified. Spouse is at [facility] now to complete admission papers.   Cori RazorJamie Broly Hatfield LCSW [phone]

## 2013-12-31 NOTE — Progress Notes (Signed)
Discharge instructions given to patient.  Copy provided to patient

## 2013-12-31 NOTE — Progress Notes (Signed)
Clinical Social Work Department BRIEF PSYCHOSOCIAL ASSESSMENT 12/31/2013  Patient:  Kimberly Quinn, Kimberly Quinn     Account Number:  [id number]     [place] date:  12/30/2013  Clinical Social Worker:  Lacie Scotts  Date/Time:  12/31/2013 02:02 PM  Referred by:  Physician  Date Referred:  12/31/2013  Other Referral:   Interview type:  Patient Other interview type:    PSYCHOSOCIAL DATA Living Status:  HUSBAND Admitted from facility:   Level of care:   Primary support name:  Broadus John Primary support relationship to patient:  SPOUSE Degree of support available:   supportive    CURRENT CONCERNS Current Concerns  Post-Acute Placement   Other Concerns:    SOCIAL WORK ASSESSMENT / PLAN Pt is an 80 yr old female living at home prior to hospitalization. CSW met with pt / spouse / RNCM to assist with d/c planning. PN reviewed. PT is recommending [facility] following hospital d/c. MD has provided d/c order for d/c today. Pt / spouse agree with recommendations for rehab. Pt has [employer] which require prior authorization. Clinicals have been submitted and a decision is pending. SNF search has been initiated and bed offers pending. [facility] will provide LOG while awaiting [facility] Medicare decision. This has been explained to pt / spouse and they have voiced their understanding.   Assessment/plan status:  Psychosocial Support/Ongoing Assessment of Needs Other assessment/ plan:   Information/referral to community resources:   Insurance coverage for SNF placement and ambulance transport  reviewed. Authorization for SNF and ambulance transport has been requested.LOG coverage reviewed.    PATIENT'S/FAMILY'S RESPONSE TO PLAN OF CARE: Pt / spouse feel rehab is needed prior to returning home. They are disappointed [facility] is unable to assist. They appreciate [facility] willingness  to assist with LOG.    Werner Lean LCSW [phone]

## 2013-12-31 NOTE — Progress Notes (Addendum)
Patient ID: Kimberly Quinn, female   DOB: Feb 23, 1923, 80 y.o.   MRN: [id number] [facility] PROGRESS NOTE  Kimberly Quinn ZHY:[id number] DOB: Feb 23, 1923 DOA: 12/30/2013 PCP: Lupita RaiderSHAW,[name], MD  Brief narrative:    80 y.o. female with history of hypertension who [facility] with complaints of worsening low back pain started about 2 weeks prior to this admission and progressively getting worse. Patient reported pain to be radiating to the right leg. No reports of loss of sensation or strength. MRI on admission showed spinal stenosis at the lumbar area with foraminal encroachments. There is also a chronic L2 fracture. Patient has been admitted for further management as patient is unable to ambulate and has minimal help at home. Blood work on the admission revealed hyponatremia with sodium of 125, white blood cell count 14, hemoglobin 10.1.  Assessment/Plan:    Principal Problem: Low back pain  Based on MRI done on the admission there is spinal stenosis at the lumbar area similar to prior study with foraminal encroachments, chronic L2 fracture.  Obtain physical therapy evaluation.  Continue pain management efforts: Patient is on a morphine 1 mg IV every 4 hours as needed for severe pain, Norco 1-2 tablets every 12 hours as needed for moderate pain.  Active Problems: Essential hypertension  Continue Norvasc 10 mg daily, and Avapro 300 mg daily  Hyponatremia  Likely secondary to dehydration.  Sodium has improved since the admission with IV fluids. Sodium is 132 this morning.  Leukocytosis  No fevers, urinalysis is unremarkable and chest x-ray did not reveal acute cardiopulmonary process. Likely reactive.   Normocytic normochromic anemia  Right lung base nodule  Incidental finding on CT renal stone study on 12/31/2013. Per guidelines, if patient is at high risk for bronchogenic carcinoma than follow-up CT chest is needed in one year from the state. If patient is not at  a low risk for bronchogenic carcinoma than follow-up is not needed   DVT Prophylaxis   SCDs bilaterally.   Code Status: Full.    IV access:  Peripheral IV  Procedures and diagnostic studies:    Mr Lumbar Spine Wo Contrast 12/30/2013 Chronic fracture L2.  No acute fracture.  Right foraminal encroachment due to spurring at L1-2 is unchanged.  Moderately severe spinal stenosis L3-4 with moderate foraminal encroachment bilaterally  5 mm anterior slip L4 -5 with severe spinal stenosis similar to the prior study. Subarticular and foraminal encroachment bilaterally.  L5-S1 left foraminal encroachment due to disc and osteophyte. This is unchanged.   Dg Chest Port 1 View 12/31/2013   1. Bibasilar opacity, subsegmental atelectasis based on CT from yesterday. 2. COPD changes.     Ct Renal Stone Study 12/31/2013   1.  No urinary tract calculi or hydronephrosis. 2. Nonobstructive left-sided colon positioned within an inguinal hernia. 3.  Possible constipation. 4. 3 mm right lung base nodule. If the patient is at high risk for bronchogenic carcinoma, follow-up chest CT at 1 year is recommended. If the patient is at low risk, no follow-up is needed.   Medical Consultants:  None   Other Consultants:  Physical therapy   IAnti-Infectives:   None    Manson PasseyEVINE, ALMA, MD  [facility] Pager [phone]  If 7PM-7AM, please contact night-coverage [email or web address] Password [email or web address] 12/31/2013, 7:07 AM   LOS: 1 day    HPI/Subjective: No acute overnight events.  Objective: Filed Vitals:   12/30/13 2229 12/31/13 0100 12/31/13 0139 12/31/13 0545  BP: 156/45  163/54 154/60  Pulse:  94  83 86  Temp:   98.3 F (36.8 C) 97.6 F (36.4 C)  TempSrc:   Oral Oral  Resp:   18 18  Height:  5\' 1"  (1.549 m)    Weight:  59.104 kg (130 lb 4.8 oz)    SpO2: 92%  90% 93%    Intake/Output Summary (Last 24 hours) at 12/31/13 0707 Last data filed at 12/31/13 0600  Gross per 24 hour  Intake  18.75 ml  Output     400 ml  Net -381.25 ml    Exam:   General:  Pt is not in acute distress  Cardiovascular: Regular rate and rhythm, S1/S2 appreciated   Respiratory: no wheezing, no crackles, no rhonchi  Abdomen: Soft, non tender, non distended, bowel sounds present  Extremities: pulses DP and PT palpable bilaterally  Neuro: Grossly nonfocal  Data Reviewed: Basic Metabolic Panel:  Recent Labs Lab 12/30/13 1847 12/31/13 0520  NA 125* 132*  K 5.0 4.6  CL 97 102  CO2 20 24  GLUCOSE 108* 122*  BUN 24* 16  CREATININE 0.55 0.43*  CALCIUM 9.2 9.6   Liver Function Tests:  Recent Labs Lab 12/31/13 0520  AST 25  ALT 18  ALKPHOS 71  BILITOT 0.9  PROT 6.8  ALBUMIN 3.4*   No results for input(s): LIPASE, AMYLASE in the last 168 hours. No results for input(s): AMMONIA in the last 168 hours. CBC:  Recent Labs Lab 12/30/13 1847 12/31/13 0520  WBC 14.0* 13.6*  NEUTROABS  --  11.5*  HGB 10.1* 11.3*  HCT 30.5* 34.5*  MCV 87.1 87.1  PLT 274 321   Cardiac Enzymes: No results for input(s): CKTOTAL, CKMB, CKMBINDEX, TROPONINI in the last 168 hours. BNP: Invalid input(s): POCBNP CBG: No results for input(s): GLUCAP in the last 168 hours.  No results found for this or any previous visit (from the past 240 hour(s)).   Scheduled Meds: . amLODipine  10 mg Oral Daily   And  . irbesartan  300 mg Oral Daily  . famotidine  20 mg Oral BID  . LORazepam  0.5 mg Oral BID  . omega-3 acid ethyl esters  1 g Oral Daily   Continuous Infusions: . sodium chloride 75 mL/hr at 12/31/13 0600

## 2013-12-31 NOTE — Discharge Instructions (Signed)
Back Pain, Adult Low back pain is very common. About 1 in 5 people have back pain.The cause of low back pain is rarely dangerous. The pain often gets better over time.About half of people with a sudden onset of back pain feel better in just 2 weeks. About 8 in 10 people feel better by 6 weeks.  CAUSES Some common causes of back pain include:  Strain of the muscles or ligaments supporting the spine.  Wear and tear (degeneration) of the spinal discs.  Arthritis.  Direct injury to the back. DIAGNOSIS Most of the time, the direct cause of low back pain is not known.However, back pain can be treated effectively even when the exact cause of the pain is unknown.Answering your caregiver's questions about your overall health and symptoms is one of the most accurate ways to make sure the cause of your pain is not dangerous. If your caregiver needs more information, he or she may order lab work or imaging tests (X-rays or MRIs).However, even if imaging tests show changes in your back, this usually does not require surgery. HOME CARE INSTRUCTIONS For many people, back pain returns.Since low back pain is rarely dangerous, it is often a condition that people can learn to manageon their own.   Remain active. It is stressful on the back to sit or stand in one place. Do not sit, drive, or stand in one place for more than 30 minutes at a time. Take short walks on level surfaces as soon as pain allows.Try to increase the length of time you walk each day.  Do not stay in bed.Resting more than 1 or 2 days can delay your recovery.  Do not avoid exercise or work.Your body is made to move.It is not dangerous to be active, even though your back may hurt.Your back will likely heal faster if you return to being active before your pain is gone.  Pay attention to your body when you bend and lift. Many people have less discomfortwhen lifting if they bend their knees, keep the load close to their bodies,and  avoid twisting. Often, the most comfortable positions are those that put less stress on your recovering back.  Find a comfortable position to sleep. Use a firm mattress and lie on your side with your knees slightly bent. If you lie on your back, put a pillow under your knees.  Only take over-the-counter or prescription medicines as directed by your caregiver. Over-the-counter medicines to reduce pain and inflammation are often the most helpful.Your caregiver may prescribe muscle relaxant drugs.These medicines help dull your pain so you can more quickly return to your normal activities and healthy exercise.  Put ice on the injured area.  Put ice in a plastic bag.  Place a towel between your skin and the bag.  Leave the ice on for 15-20 minutes, 03-04 times a day for the first 2 to 3 days. After that, ice and heat may be alternated to reduce pain and spasms.  Ask your caregiver about trying back exercises and gentle massage. This may be of some benefit.  Avoid feeling anxious or stressed.Stress increases muscle tension and can worsen back pain.It is important to recognize when you are anxious or stressed and learn ways to manage it.Exercise is a great option. SEEK MEDICAL CARE IF:  You have pain that is not relieved with rest or medicine.  You have pain that does not improve in 1 week.  You have new symptoms.  You are generally not feeling well. SEEK   IMMEDIATE MEDICAL CARE IF:   You have pain that radiates from your back into your legs.  You develop new bowel or bladder control problems.  You have unusual weakness or numbness in your arms or legs.  You develop nausea or vomiting.  You develop abdominal pain.  You feel faint. Document Released: 12/18/2004 Document Revised: 06/19/2011 Document Reviewed: 04/21/2013 Exit[employer] Patient Information 2015 [employer]. This information is not intended to replace advice given to you by your health care provider. Make sure you  discuss any questions you have with your health care provider.  

## 2013-12-31 NOTE — Evaluation (Signed)
Physical Therapy Evaluation Patient Details Name: Kimberly Quinn MRN: [id number] DOB: 1923/09/28 Today's Date: 12/31/2013   History of Present Illness  80 y.o. female with history of hypertension admitted with complaints of worsening low back pain. MRI: spinal stenosis at the lumbar area with foraminal encroachments, chronic L2 fracture    Clinical Impression  Pt admitted with above diagnosis. Pt currently with functional limitations due to the deficits listed below (see PT Problem List).  Pt will benefit from skilled PT to increase their independence and safety with mobility to allow discharge to the venue listed below.  Pt uncomfortable in recliner and still required encouragement to mobilize and then would be assisted into more comfortable position, with pt eventually agreeable to back to bed.  Pt did not feel able to ambulate today.     Follow Up Recommendations SNF    Equipment Recommendations  None recommended by PT    Recommendations for Other Services       Precautions / Restrictions Precautions Precautions: Fall      Mobility  Bed Mobility Overal bed mobility: Needs Assistance;+2 for physical assistance Bed Mobility: Sit to Supine       Sit to supine: Mod assist;+2 for physical assistance   General bed mobility comments: attempted to educate on log roll technique however despite cues pt returned to supine, assist for LEs  Transfers Overall transfer level: Needs assistance Equipment used: Rolling walker (2 wheeled) Transfers: Sit to/from UGI CorporationStand;Stand Pivot Transfers Sit to Stand: Mod assist;+2 safety/equipment;+2 physical assistance Stand pivot transfers: Mod assist;+2 safety/equipment;+2 physical assistance       General transfer comment: verbal cues for technique, assist for rise and steadying with transfer due to increase in pt's back pain  Ambulation/Gait Ambulation/Gait assistance:  (pt felt unable today)              Stairs             Wheelchair Mobility    Modified Rankin (Stroke Patients Only)       Balance                                             Pertinent Vitals/Pain Pain Assessment: Faces Pain Score:  (pt did not numerically rate despite question) Faces Pain Scale: Hurts even more Pain Descriptors / Indicators: Aching;Grimacing Pain Intervention(s): Limited activity within patient's tolerance;Monitored during session;Repositioned    Home Living Family/patient expects to be discharged to:: Private residence Living Arrangements: Spouse/significant other             Home Equipment: Environmental consultantWalker - 2 wheels;Cane - single point      Prior Function Level of Independence: Independent with assistive device(s)               Hand Dominance   Dominant Hand: Left    Extremity/Trunk Assessment               Lower Extremity Assessment: Generalized weakness         Communication   Communication: No difficulties  Cognition Arousal/Alertness: Awake/alert Behavior During Therapy: WFL for tasks assessed/performed Overall Cognitive Status: Within Functional Limits for tasks assessed                      General Comments      Exercises        Assessment/Plan    PT Assessment Patient  needs continued PT services  PT Diagnosis Difficulty walking;Acute pain   PT Problem List Decreased strength;Decreased activity tolerance;Decreased mobility;Decreased balance;Pain  PT Treatment Interventions DME instruction;Gait training;Functional mobility training;Patient/family education;Therapeutic activities;Therapeutic exercise;Balance training   PT Goals (Current goals can be found in the Care Plan section) Acute Rehab PT Goals PT Goal Formulation: With patient Time For Goal Achievement: 01/07/14 Potential to Achieve Goals: Good    Frequency Min 3X/week   Barriers to discharge        Co-evaluation               End of Session Equipment Utilized During  Treatment: Oxygen Activity Tolerance: Patient limited by pain Patient left: in bed;with call bell/phone within reach;with bed alarm set;with nursing/sitter in room           Time: [id number] PT Time Calculation (min) (ACUTE ONLY): 15 min   Charges:   PT Evaluation $Initial PT Evaluation Tier I: 1 Procedure PT Treatments $Therapeutic Activity: 8-22 mins   PT G Codes:        Miyoko Hashimi,KATHrine E 12/31/2013, 12:30 PM Kimberly Quinn, PT, DPT 12/31/2013 Pager: [phone]

## 2013-12-31 NOTE — Progress Notes (Signed)
Report given to PTAR before transport to facility.

## 2013-12-31 NOTE — ED Notes (Signed)
Patient transported to CT 

## 2013-12-31 NOTE — H&P (Addendum)
[facility] History and Physical  Kimberly Quinn ZOX:[id number] DOB: 09/21/1923 DOA: 12/30/2013  Referring physician: ER physician. PCP: Lupita RaiderSHAW,[name], MD   Chief Complaint: Low back pain.  HPI: Kimberly GilmoreFrances C Quinn is a 80 y.o. female with history of hypertension presents to the ER because of worsening low back pain. Patient states that her low back pain started 2 weeks ago which is gradually worsening. Patient's pain radiates to her right leg. Denies any loss of function of the lower extremities any incontinence of urine or bowels. Patient's pain increases on minimal movement to the point that she not able to ambulate. MRI done in the ER shows spinal stenosis at the lumbar area with foraminal encroachments. There is also a chronic L2 fracture. Patient has been admitted for further management as patient is unable to ambulate and has minimal help at home. Patient's labs show hyponatremia. Patient denies any nausea vomiting or diarrhea or any new medications. Patient is not on any diuretics.   Review of Systems: As presented in the history of presenting illness, rest negative.  Past Medical History  Diagnosis Date  . Hypertension    Past Surgical History  Procedure Laterality Date  . Tonsillectomy    . Appendectomy    . Knee surgery     Social History:  reports that she quit smoking about 56 years ago. She has never used smokeless tobacco. She reports that she does not drink alcohol or use illicit drugs. Where does patient live home. Can patient participate in ADLs? Yes.  Allergies  Allergen Reactions  . Sulfamethoxazole-Trimethoprim Rash    Family History:  Family History  Problem Relation Age of Onset  . Lung cancer Father       Prior to Admission medications   Medication Sig Start Date End Date Taking? Authorizing Provider  amLODipine-olmesartan (AZOR) 10-40 MG per tablet Take 1 tablet by mouth daily.   Yes Historical Provider, MD  aspirin EC 81 MG tablet Take 81 mg  by mouth every other day.   Yes Historical Provider, MD  cholecalciferol (VITAMIN D) 1000 UNITS tablet Take 1,000 Units by mouth daily.   Yes Historical Provider, MD  CRANBERRY PO Take 1 tablet by mouth daily with lunch.   Yes Historical Provider, MD  HYDROcodone-acetaminophen (NORCO/VICODIN) 5-325 MG per tablet Take 1-2 tablets by mouth every 12 (twelve) hours as needed for moderate pain.   Yes Historical Provider, MD  ibuprofen (ADVIL,MOTRIN) 200 MG tablet Take 200 mg by mouth daily.   Yes Historical Provider, MD  loratadine (CLARITIN) 10 MG tablet Take 10 mg by mouth daily as needed for allergies. For allergies   Yes Historical Provider, MD  LORazepam (ATIVAN) 0.5 MG tablet Take 0.5 mg by mouth 2 (two) times daily.    Yes Historical Provider, MD  Omega-3 Fatty Acids (FISH OIL) 1200 MG CAPS Take 1,200 mg by mouth 2 (two) times daily.   Yes Historical Provider, MD  Propylene Glycol (SYSTANE BALANCE OP) Apply 1 drop to eye 4 (four) times daily.   Yes Historical Provider, MD  ranitidine (ZANTAC) 150 MG capsule Take 150 mg by mouth 2 (two) times daily.   Yes Historical Provider, MD    Physical Exam: Filed Vitals:   12/30/13 1838 12/30/13 2229 12/31/13 0100 12/31/13 0139  BP: 144/51 156/45  163/54  Pulse: 89 94  83  Temp: 98.3 F (36.8 C)   98.3 F (36.8 C)  TempSrc: Oral   Oral  Resp: 18   18  Height:  5\' 1"  (1.549 m)   Weight:   59.104 kg (130 lb 4.8 oz)   SpO2: 94% 92%  90%     General:  Moderately built and nourished.  Eyes: Anicteric no pallor.  ENT: No discharge from the ears eyes nose or mouth.  Neck: No mass felt.  Cardiovascular: S1-S2 heard.  Respiratory: No rhonchi or crepitations.  Abdomen: Soft nontender bowel sounds present.  Skin: No rash.  Musculoskeletal: Back pain on moving her lower extreme is particularly the right lower extremity. Patient has severe restriction of her movement because of pain.  Psychiatric: Appears normal.  Neurologic: Alert awake  oriented to time place and person. Moves all extremities but limited in the lower extremities due to pain.  Labs on Admission:  Basic Metabolic Panel:  Recent Labs Lab 12/30/13 1847  NA 125*  K 5.0  CL 97  CO2 20  GLUCOSE 108*  BUN 24*  CREATININE 0.55  CALCIUM 9.2   Liver Function Tests: No results for input(s): AST, ALT, ALKPHOS, BILITOT, PROT, ALBUMIN in the last 168 hours. No results for input(s): LIPASE, AMYLASE in the last 168 hours. No results for input(s): AMMONIA in the last 168 hours. CBC:  Recent Labs Lab 12/30/13 1847  WBC 14.0*  HGB 10.1*  HCT 30.5*  MCV 87.1  PLT 274   Cardiac Enzymes: No results for input(s): CKTOTAL, CKMB, CKMBINDEX, TROPONINI in the last 168 hours.  BNP (last 3 results) No results for input(s): PROBNP in the last 8760 hours. CBG: No results for input(s): GLUCAP in the last 168 hours.  Radiological Exams on Admission: Mr Lumbar Spine Wo Contrast  12/30/2013   CLINICAL DATA:  Low back pain  EXAM: MRI LUMBAR SPINE WITHOUT CONTRAST  TECHNIQUE: Multiplanar, multisequence MR imaging of the lumbar spine was performed. No intravenous contrast was administered.  COMPARISON:  Lumbar MRI 03/12/2012  FINDINGS: Lumbar scoliosis. 3 cm hepatic cyst in the dome of the liver. Distended urinary bladder.  Conus medullaris is normal and terminates at L1-2. Chronic fracture L2. No acute fracture or mass.  T12-L1:  Mild disc and facet degeneration without stenosis  L1-2: Disc degeneration and spurring on the right with right foraminal encroachment. This was present previously. Central canal widely patent  L2-3: Disc and facet degeneration. Foraminal narrowing bilaterally due to spurring. Mild narrowing of the canal without significant spinal stenosis.  L3-4: 3 mm anterior slip. Advanced facet degeneration. Disc degeneration and spondylosis. Moderately severe spinal stenosis has progressed. Moderate foraminal encroachment bilaterally similar to the other study.   L4-5: 5 mm anterior slip. Severe facet degeneration and severe spinal stenosis. Subarticular and foraminal encroachment bilaterally.  L5-S1: Disc degeneration and spurring. Left-sided disc or osteophyte is unchanged from the prior study. Mild left foraminal narrowing is unchanged.  IMPRESSION: Chronic fracture L2.  No acute fracture.  Right foraminal encroachment due to spurring at L1-2 is unchanged.  Moderately severe spinal stenosis L3-4 with moderate foraminal encroachment bilaterally  5 mm anterior slip L4 -5 with severe spinal stenosis similar to the prior study. Subarticular and foraminal encroachment bilaterally.  L5-S1 left foraminal encroachment due to disc and osteophyte. This is unchanged.   Electronically Signed   By: Marlan Palau M.D.   On: 12/30/2013 20:43   Ct Renal Stone Study  12/31/2013   CLINICAL DATA:  Low back and bilateral flank pain. History of vertebral fractures. Hypertension.  EXAM: CT ABDOMEN AND PELVIS WITHOUT CONTRAST  TECHNIQUE: Multidetector CT imaging of the abdomen and pelvis was performed  following the standard protocol without IV contrast.  COMPARISON:  Lumbar spine MRI of earlier today. 11/21/2013 abdominal pelvic screws 11/22/2011 abdominal pelvic CT.  FINDINGS: Lower chest: 3 mm right lower lobe lung nodule on image 4. Not definitely apparent on the prior exam. Mild cardiomegaly. Small hiatal hernia. Trace right pleural fluid or thickening.  Hepatobiliary: Hepatic cysts. Cholecystectomy without gross biliary ductal dilatation.  Pancreas: Mild pancreatic atrophy without ductal dilatation.  Spleen: Normal  Adrenals/Urinary Tract: Normal adrenal glands. Right greater than left renal cortical thinning. No renal calculi or hydronephrosis. No hydroureter or ureteric calculi. No bladder calculi.  Stomach/Bowel: Small hiatal hernia. Otherwise normal appearance of the stomach. Colonic stool burden suggests constipation. A left inguinal hernia contains nonobstructive left-sided  colon. Is difficult to ascertain if this is splenic flexure or sigmoid region. Example image 61. No obstruction or evidence of strangulation. Normal terminal ileum. Normal small bowel.  Vascular/Lymphatic: Aortic and branch vessel atherosclerosis. No abdominopelvic adenopathy.  Reproductive: Uterine atrophy versus less likely partial hysterectomy. No adnexal mass.  Other:  No significant free fluid.  Musculoskeletal: Moderate osteopenia. Remote inferior lateral right rib fractures as evidenced by sclerosis. Lumbar spondylosis with a chronic L2 compression deformity as detailed on yesterday's lumbar spine MRI. Moderate convex right lumbar spine curvature.  IMPRESSION: 1.  No urinary tract calculi or hydronephrosis. 2. Nonobstructive left-sided colon Positioned within an inguinal hernia. 3.  Possible constipation. 4. 3 mm right lung base nodule. If the patient is at high risk for bronchogenic carcinoma, follow-up chest CT at 1 year is recommended. If the patient is at low risk, no follow-up is needed. This recommendation follows the consensus statement: "Guidelines for Management of Small Pulmonary Nodules Detected on CT Scans: A Statement from the Fleischner Society" as published in Radiology 2005; 237:395-400. Available online at: DietDisorder.[email or web address]://www.med.[employer].edu/rad/res/Fleischner-nodule.htm.   Electronically Signed   By: Jeronimo GreavesKyle  Talbot M.D.   On: 12/31/2013 00:39     Assessment/Plan Principal Problem:   Low back pain Active Problems:   HTN (hypertension)   Hyponatremia   Normocytic normochromic anemia   1. Low back pain - with MRI showing spinal stenosis and foraminal encroachments at this time patient has been placed on pain medication and physical therapy has been consulted. We'll also add when necessary Robaxin for muscle relaxation. May discuss with neurosurgery in a.m. to see if patient may benefit from epidural injections. 2. Hyponatremia - patient is getting gentle hydration at this time. Check urine  sodium and osmolality. Check TSH. Closely follow metabolic panel. Check chest x-ray. 3. Hypertension uncontrolled - continue home medication and when necessary IV hydralazine. Blood pressure may be uncontrolled secondary to patient's discomfort. 4. Normocytic normochromic anemia which is chronic - check anemia panel and follow CBC. 5. Leukocytosis - patient is afebrile and UA is unremarkable. Probably reactionary. Follow chest x-ray.   Patient was found to be mildly hypoxic after admission but patient is not in distress. Follow chest x-ray.  DVT Prophylaxis SCDs. At this time I'm awaiting Lovenox in case patient requires epidural injections.  Code Status: Full code.  Family Communication: None.  Disposition Plan: Admit to inpatient.    Jacobo Moncrief N. [facility] Pager [phone].  If 7PM-7AM, please contact night-coverage [email or web address] Password [email or web address] 12/31/2013, 2:34 AM

## 2013-12-31 NOTE — Progress Notes (Signed)
Report called [facility] Aundra [facility]Luvey Julius [facility].  Questions answered.  Return number to [facility] provided for any further questions

## 2013-12-31 NOTE — Discharge Summary (Signed)
Physician Discharge Summary  Kimberly Quinn:[id number] DOB: [birth year] DOA: 12/30/2013  PCP: Lupita Raider, MD  Admit date: 12/30/2013 Discharge date: 12/31/2013  Recommendations for Outpatient Follow-up:  1. Check CBC and BMP per SNF protocol.  Discharge Diagnoses:  Principal Problem:   Low back pain Active Problems:   HTN (hypertension)   Hyponatremia   Normocytic normochromic anemia   Lung nodule    Discharge Condition: stable   Diet recommendation: as tolerated   History of present illness:  80 y.o. female with history of hypertension who presented to [facility] ED with complaints of worsening low back pain started about 2 weeks prior to this admission and progressively getting worse. Patient reported pain to be radiating to the right leg. No reports of loss of sensation or strength. MRI on admission showed spinal stenosis at the lumbar area with foraminal encroachments. There is also a chronic L2 fracture. Patient has been admitted for further management as patient is unable to ambulate and has minimal help at home. Blood work on the admission revealed hyponatremia with sodium of 125, white blood cell count 14, hemoglobin 10.1.  Assessment/Plan:    Principal Problem: Low back pain  Based on MRI done on the admission there is spinal stenosis at the lumbar area similar to prior study with foraminal encroachments, chronic L2 fracture.  SNF on discharge.  Norco PO PRN pain control.   Active Problems: Essential hypertension  Continue Norvasc 10 mg daily, and Avapro 300 mg daily  Hyponatremia  Likely secondary to dehydration.  Sodium has improved since the admission with IV fluids. Sodium is 132 this morning.  Leukocytosis  No fevers, urinalysis is unremarkable and chest x-ray did not reveal acute cardiopulmonary process. Likely reactive.  Right lung base nodule  Incidental finding on CT renal stone study on 12/31/2013. Per guidelines, if patient is at high  risk for bronchogenic carcinoma than follow-up CT chest is needed in one year from the state. If patient is not at a low risk for bronchogenic carcinoma than follow-up is not needed   DVT Prophylaxis   SCDs bilaterally.   Code Status: Full.  Family Communication: family not at the bedside; spoke with pt about the plan of care    IV access:  Peripheral IV  Procedures and diagnostic studies:   Mr Lumbar Spine Wo Contrast 12/30/2013 Chronic fracture L2. No acute fracture. Right foraminal encroachment due to spurring at L1-2 is unchanged. Moderately severe spinal stenosis L3-4 with moderate foraminal encroachment bilaterally 5 mm anterior slip L4 -5 with severe spinal stenosis similar to the prior study. Subarticular and foraminal encroachment bilaterally. L5-S1 left foraminal encroachment due to disc and osteophyte. This is unchanged.   Dg Chest Port 1 View 12/31/2013 1. Bibasilar opacity, subsegmental atelectasis based on CT from yesterday. 2. COPD changes.   Ct Renal Stone Study 12/31/2013 1. No urinary tract calculi or hydronephrosis. 2. Nonobstructive left-sided colon positioned within an inguinal hernia. 3. Possible constipation. 4. 3 mm right lung base nodule. If the patient is at high risk for bronchogenic carcinoma, follow-up chest CT at 1 year is recommended. If the patient is at low risk, no follow-up is needed.   Medical Consultants:  None   Other Consultants:  Physical therapy   IAnti-Infectives:   None    Signed:  Manson Passey, MD  [facility] 12/31/2013, 1:38 PM  Pager #: [phone]  Discharge Exam: Filed Vitals:   12/31/13 0545  BP: 154/60  Pulse: 86  Temp: 97.6 F (36.4 C)  Resp: 18   Filed Vitals:   12/30/13 2229 12/31/13 0100 12/31/13 0139 12/31/13 0545  BP: 156/45  163/54 154/60  Pulse: 94  83 86  Temp:   98.3 F (36.8 C) 97.6 F (36.4 C)  TempSrc:   Oral Oral  Resp:   18 18  Height:  5\' 1"  (1.549 m)     Weight:  59.104 kg (130 lb 4.8 oz)    SpO2: 92%  90% 93%    General: Pt is not in acute distress Cardiovascular: Regular rate and rhythm, S1/S2 appreciated  Respiratory: no wheezing, no crackles, no rhonchi Abdominal: Soft, non tender, non distended, bowel sounds +, no guarding Extremities: no edema, no cyanosis, pulses palpable bilaterally DP and PT Neuro: Grossly nonfocal  Discharge Instructions  Discharge Instructions    Call MD for:  difficulty breathing, headache or visual disturbances    Complete by:  As directed      Call MD for:  persistant nausea and vomiting    Complete by:  As directed      Call MD for:  severe uncontrolled pain    Complete by:  As directed      Diet - low sodium heart healthy    Complete by:  As directed      Increase activity slowly    Complete by:  As directed             Medication List    STOP taking these medications        ibuprofen 200 MG tablet  Commonly known as:  ADVIL,MOTRIN     LORazepam 0.5 MG tablet  Commonly known as:  ATIVAN      TAKE these medications        acetaminophen 325 MG tablet  Commonly known as:  TYLENOL  Take 2 tablets (650 mg total) by mouth every 6 (six) hours as needed for mild pain (or Fever >/= 101).     aspirin EC 81 MG tablet  Take 81 mg by mouth every other day.     AZOR 10-40 MG per tablet  Generic drug:  amLODipine-olmesartan  Take 1 tablet by mouth daily.     cholecalciferol 1000 UNITS tablet  Commonly known as:  VITAMIN D  Take 1,000 Units by mouth daily.     CRANBERRY PO  Take 1 tablet by mouth daily with lunch.     Fish Oil 1200 MG Caps  Take 1,200 mg by mouth 2 (two) times daily.     HYDROcodone-acetaminophen 5-325 MG per tablet  Commonly known as:  NORCO/VICODIN  Take 1-2 tablets by mouth every 12 (twelve) hours as needed for moderate pain.     loratadine 10 MG tablet  Commonly known as:  CLARITIN  Take 10 mg by mouth daily as needed for allergies. For allergies      ondansetron 4 MG tablet  Commonly known as:  ZOFRAN  Take 1 tablet (4 mg total) by mouth every 6 (six) hours as needed for nausea.     ranitidine 150 MG capsule  Commonly known as:  ZANTAC  Take 150 mg by mouth 2 (two) times daily.     SYSTANE BALANCE OP  Apply 1 drop to eye 4 (four) times daily.           Follow-up Information    Follow up with SHAW,[name], MD In 2 days.   Specialty:  Family Medicine   Contact information:   [address] [place] [address]  [phone]        The results of significant diagnostics from this hospitalization (including imaging, microbiology, ancillary and laboratory) are listed below for reference.    Significant Diagnostic Studies: Mr Lumbar Spine Wo Contrast  12/30/2013   CLINICAL DATA:  Low back pain  EXAM: MRI LUMBAR SPINE WITHOUT CONTRAST  TECHNIQUE: Multiplanar, multisequence MR imaging of the lumbar spine was performed. No intravenous contrast was administered.  COMPARISON:  Lumbar MRI 03/12/2012  FINDINGS: Lumbar scoliosis. 3 cm hepatic cyst in the dome of the liver. Distended urinary bladder.  Conus medullaris is normal and terminates at L1-2. Chronic fracture L2. No acute fracture or mass.  T12-L1:  Mild disc and facet degeneration without stenosis  L1-2: Disc degeneration and spurring on the right with right foraminal encroachment. This was present previously. Central canal widely patent  L2-3: Disc and facet degeneration. Foraminal narrowing bilaterally due to spurring. Mild narrowing of the canal without significant spinal stenosis.  L3-4: 3 mm anterior slip. Advanced facet degeneration. Disc degeneration and spondylosis. Moderately severe spinal stenosis has progressed. Moderate foraminal encroachment bilaterally similar to the other study.  L4-5: 5 mm anterior slip. Severe facet degeneration and severe spinal stenosis. Subarticular and foraminal encroachment bilaterally.  L5-S1: Disc degeneration and spurring.  Left-sided disc or osteophyte is unchanged from the prior study. Mild left foraminal narrowing is unchanged.  IMPRESSION: Chronic fracture L2.  No acute fracture.  Right foraminal encroachment due to spurring at L1-2 is unchanged.  Moderately severe spinal stenosis L3-4 with moderate foraminal encroachment bilaterally  5 mm anterior slip L4 -5 with severe spinal stenosis similar to the prior study. Subarticular and foraminal encroachment bilaterally.  L5-S1 left foraminal encroachment due to disc and osteophyte. This is unchanged.   Electronically Signed   By: Marlan Palau M.D.   On: 12/30/2013 20:43   Dg Chest Port 1 View  12/31/2013   CLINICAL DATA:  Hypoxia  EXAM: PORTABLE CHEST - 1 VIEW  COMPARISON:  None.  FINDINGS: Streaky lower lung opacities represents subsegmental atelectasis based on preceding abdominal CT. The lungs are hyperinflated and the interstitium is diffusely coarsened, suspect COPD. There is no edema, consolidation, effusion, or pneumothorax.  Moderate cardiomegaly.  There is mild tortuosity of the aorta.  IMPRESSION: 1. Bibasilar opacity, subsegmental atelectasis based on CT from yesterday. 2. COPD changes.   Electronically Signed   By: Tiburcio Pea M.D.   On: 12/31/2013 03:13   Ct Renal Stone Study  12/31/2013   CLINICAL DATA:  Low back and bilateral flank pain. History of vertebral fractures. Hypertension.  EXAM: CT ABDOMEN AND PELVIS WITHOUT CONTRAST  TECHNIQUE: Multidetector CT imaging of the abdomen and pelvis was performed following the standard protocol without IV contrast.  COMPARISON:  Lumbar spine MRI of earlier today. 11/21/2013 abdominal pelvic screws 11/22/2011 abdominal pelvic CT.  FINDINGS: Lower chest: 3 mm right lower lobe lung nodule on image 4. Not definitely apparent on the prior exam. Mild cardiomegaly. Small hiatal hernia. Trace right pleural fluid or thickening.  Hepatobiliary: Hepatic cysts. Cholecystectomy without gross biliary ductal dilatation.  Pancreas:  Mild pancreatic atrophy without ductal dilatation.  Spleen: Normal  Adrenals/Urinary Tract: Normal adrenal glands. Right greater than left renal cortical thinning. No renal calculi or hydronephrosis. No hydroureter or ureteric calculi. No bladder calculi.  Stomach/Bowel: Small hiatal hernia. Otherwise normal appearance of the stomach. Colonic stool burden suggests constipation. A left inguinal hernia contains nonobstructive left-sided colon. Is difficult to ascertain if this is splenic flexure or sigmoid region.  Example image 61. No obstruction or evidence of strangulation. Normal terminal ileum. Normal small bowel.  Vascular/Lymphatic: Aortic and branch vessel atherosclerosis. No abdominopelvic adenopathy.  Reproductive: Uterine atrophy versus less likely partial hysterectomy. No adnexal mass.  Other:  No significant free fluid.  Musculoskeletal: Moderate osteopenia. Remote inferior lateral right rib fractures as evidenced by sclerosis. Lumbar spondylosis with a chronic L2 compression deformity as detailed on yesterday's lumbar spine MRI. Moderate convex right lumbar spine curvature.  IMPRESSION: 1.  No urinary tract calculi or hydronephrosis. 2. Nonobstructive left-sided colon Positioned within an inguinal hernia. 3.  Possible constipation. 4. 3 mm right lung base nodule. If the patient is at high risk for bronchogenic carcinoma, follow-up chest CT at 1 year is recommended. If the patient is at low risk, no follow-up is needed. This recommendation follows the consensus statement: "Guidelines for Management of Small Pulmonary Nodules Detected on CT Scans: A Statement from the Fleischner Society" as published in Radiology 2005; 237:395-400. Available online at: DietDisorder.czhttp://www.med.[employer].edu/rad/res/Fleischner-nodule.htm.   Electronically Signed   By: Jeronimo GreavesKyle  Talbot M.D.   On: 12/31/2013 00:39    Microbiology: No results found for this or any previous visit (from the past 240 hour(s)).   Labs: Basic Metabolic  Panel:  Recent Labs Lab 12/30/13 1847 12/31/13 0520  NA 125* 132*  K 5.0 4.6  CL 97 102  CO2 20 24  GLUCOSE 108* 122*  BUN 24* 16  CREATININE 0.55 0.43*  CALCIUM 9.2 9.6   Liver Function Tests:  Recent Labs Lab 12/31/13 0520  AST 25  ALT 18  ALKPHOS 71  BILITOT 0.9  PROT 6.8  ALBUMIN 3.4*   No results for input(s): LIPASE, AMYLASE in the last 168 hours. No results for input(s): AMMONIA in the last 168 hours. CBC:  Recent Labs Lab 12/30/13 1847 12/31/13 0520  WBC 14.0* 13.6*  NEUTROABS  --  11.5*  HGB 10.1* 11.3*  HCT 30.5* 34.5*  MCV 87.1 87.1  PLT 274 321   Cardiac Enzymes: No results for input(s): CKTOTAL, CKMB, CKMBINDEX, TROPONINI in the last 168 hours. BNP: BNP (last 3 results) No results for input(s): PROBNP in the last 8760 hours. CBG: No results for input(s): GLUCAP in the last 168 hours.  Time coordinating discharge: Over 30 minutes

## 2014-01-01 NOTE — Progress Notes (Signed)
G-Codes for PT evaluation    12/31/13 1229  PT Time Calculation  PT Start Time (ACUTE ONLY) 0944  PT Stop Time (ACUTE ONLY) 0959  PT Time Calculation (min) (ACUTE ONLY) 15 min  PT G-Codes **NOT FOR INPATIENT CLASS**  Functional Assessment Tool Used clinical judgement  Functional Limitation Mobility: Walking and moving around  Mobility: Walking and Moving Around Current Status (X9147) CK  Mobility: Walking and Moving Around Goal Status (W2956) CI  Mobility: Walking and Moving Around Discharge Status (O1308) CK  PT General Charges  $$ ACUTE PT VISIT 1 Procedure  PT Evaluation  $Initial PT Evaluation Tier I 1 Procedure  PT Treatments  $Therapeutic Activity 8-22 mins   Zenovia Jarred, PT, DPT 01/01/2014 Pager: [phone]

## 2014-01-07 ENCOUNTER — Ambulatory Visit: Payer: Medicare Other | Admitting: Podiatrist

## 2014-03-03 ENCOUNTER — Other Ambulatory Visit (HOSPITAL_COMMUNITY): Payer: Self-pay | Admitting: Family Medicine

## 2014-03-03 ENCOUNTER — Ambulatory Visit (HOSPITAL_COMMUNITY)
Admission: RE | Admit: 2014-03-03 | Discharge: 2014-03-03 | Disposition: A | Discharge status: Home / Self Care | Payer: Medicare Other | Source: Ambulatory Visit | Attending: Vascular Surgery | Admitting: Vascular Surgery

## 2014-03-03 DIAGNOSIS — L97911 Non-pressure chronic ulcer of unspecified part of right lower leg limited to breakdown of skin: Secondary | ICD-10-CM | POA: Diagnosis not present

## 2014-07-22 ENCOUNTER — Encounter: Payer: Self-pay | Admitting: Podiatry

## 2014-07-22 ENCOUNTER — Ambulatory Visit (INDEPENDENT_AMBULATORY_CARE_PROVIDER_SITE_OTHER): Payer: Medicare Other | Admitting: Podiatry

## 2014-07-22 DIAGNOSIS — M79673 Pain in unspecified foot: Secondary | ICD-10-CM

## 2014-07-22 DIAGNOSIS — B351 Tinea unguium: Secondary | ICD-10-CM | POA: Diagnosis not present

## 2014-07-22 NOTE — Progress Notes (Signed)
Patient ID: Kimberly Quinn, female   DOB: November 28, 1923, 81 y.o.   MRN: [id number] Complaint:  Visit Type: Patient returns to my office for continued preventative foot care services. Complaint: Patient states" my nails have grown long and thick and become painful to walk and wear shoes" . The patient presents for preventative foot care services. No changes to ROS  Podiatric Exam: Vascular: dorsalis pedis and posterior tibial pulses are palpable bilateral. Capillary return is immediate. Temperature gradient is WNL. Skin turgor WNL  Sensorium: Normal Semmes Weinstein monofilament test. Normal tactile sensation bilaterally. Nail Exam: Pt has thick disfigured discolored nails with subungual debris noted bilateral entire nail hallux through fifth toenails Ulcer Exam: There is no evidence of ulcer or pre-ulcerative changes or infection. Orthopedic Exam: Muscle tone and strength are WNL. No limitations in general ROM. No crepitus or effusions noted. Foot type and digits show no abnormalities. Bony prominences are unremarkable. Skin: No Porokeratosis. No infection or ulcers  Diagnosis:  Onychomycosis, , Pain in right toe, pain in left toes  Treatment & Plan Procedures and Treatment: Consent by patient was obtained for treatment procedures. The patient understood the discussion of treatment and procedures well. All questions were answered thoroughly reviewed. Debridement of mycotic and hypertrophic toenails, 1 through 5 bilateral and clearing of subungual debris. No ulceration, no infection noted.  Return Visit-Office Procedure: Patient instructed to return to the office for a follow up visit 3 months for continued evaluation and treatment.

## 2014-09-21 ENCOUNTER — Encounter: Payer: Self-pay | Admitting: Podiatry

## 2014-09-21 ENCOUNTER — Ambulatory Visit (INDEPENDENT_AMBULATORY_CARE_PROVIDER_SITE_OTHER): Payer: Medicare Other | Admitting: Podiatry

## 2014-09-21 DIAGNOSIS — B351 Tinea unguium: Secondary | ICD-10-CM | POA: Diagnosis not present

## 2014-09-21 DIAGNOSIS — M79673 Pain in unspecified foot: Secondary | ICD-10-CM

## 2014-09-21 NOTE — Progress Notes (Signed)
Patient ID: Kimberly Quinn, female   DOB: 09-18-1923, 81 y.o.   MRN: [id number] Complaint:  Visit Type: Patient returns to my office for continued preventative foot care services. Complaint: Patient states" my nails have grown long and thick and become painful to walk and wear shoes" . The patient presents for preventative foot care services. No changes to ROS  Podiatric Exam: Vascular: dorsalis pedis and posterior tibial pulses are not  palpable bilateral. Capillary return is immediate. Cold feet noted.  Sensorium: Normal Semmes Weinstein monofilament test. Normal tactile sensation bilaterally. Nail Exam: Pt has thick disfigured discolored nails with subungual debris noted bilateral entire nail hallux through fifth toenails Ulcer Exam: There is no evidence of ulcer or pre-ulcerative changes or infection. Orthopedic Exam: Muscle tone and strength are WNL. No limitations in general ROM. No crepitus or effusions noted. Foot type and digits show no abnormalities. Bony prominences are unremarkable. Skin: No Porokeratosis. No infection or ulcers  Diagnosis:  Onychomycosis, , Pain in right toe, pain in left toes  Treatment & Plan Procedures and Treatment: Consent by patient was obtained for treatment procedures. The patient understood the discussion of treatment and procedures well. All questions were answered thoroughly reviewed. Debridement of mycotic and hypertrophic toenails, 1 through 5 bilateral and clearing of subungual debris. No ulceration, no infection noted.  Return Visit-Office Procedure: Patient instructed to return to the office for a follow up visit 3 months for continued evaluation and treatment.

## 2014-11-15 ENCOUNTER — Other Ambulatory Visit: Payer: Self-pay | Admitting: Physician Assistant

## 2014-11-15 DIAGNOSIS — R1084 Generalized abdominal pain: Secondary | ICD-10-CM

## 2014-12-02 ENCOUNTER — Ambulatory Visit: Payer: Medicare Other | Admitting: Podiatry

## 2014-12-02 ENCOUNTER — Encounter: Payer: Self-pay | Admitting: Podiatry

## 2014-12-02 ENCOUNTER — Ambulatory Visit (INDEPENDENT_AMBULATORY_CARE_PROVIDER_SITE_OTHER): Payer: Medicare Other | Admitting: Podiatry

## 2014-12-02 DIAGNOSIS — B351 Tinea unguium: Secondary | ICD-10-CM

## 2014-12-02 DIAGNOSIS — M79673 Pain in unspecified foot: Secondary | ICD-10-CM | POA: Diagnosis not present

## 2014-12-02 NOTE — Progress Notes (Signed)
Patient ID: Kimberly GilmoreFrances C Steinborn, female   DOB: 18-Jan-1923, 81 y.o.   MRN: [id number] Complaint:  Visit Type: Patient returns to my office for continued preventative foot care services. Complaint: Patient states" my nails have grown long and thick and become painful to walk and wear shoes" . The patient presents for preventative foot care services. No changes to ROS  Podiatric Exam: Vascular: dorsalis pedis and posterior tibial pulses are not  palpable bilateral. Capillary return is immediate. Cold feet noted.  Sensorium: Normal Semmes Weinstein monofilament test. Normal tactile sensation bilaterally. Nail Exam: Pt has thick disfigured discolored nails with subungual debris noted bilateral entire nail hallux through fifth toenails Ulcer Exam: There is no evidence of ulcer or pre-ulcerative changes or infection. Orthopedic Exam: Muscle tone and strength are WNL. No limitations in general ROM. No crepitus or effusions noted. Foot type and digits show no abnormalities. Bony prominences are unremarkable.Hammer toe second left foot. Skin: No Porokeratosis. No infection or ulcers  Diagnosis:  Onychomycosis, , Pain in right toe, pain in left toes  Treatment & Plan Procedures and Treatment: Consent by patient was obtained for treatment procedures. The patient understood the discussion of treatment and procedures well. All questions were answered thoroughly reviewed. Debridement of mycotic and hypertrophic toenails, 1 through 5 bilateral and clearing of subungual debris. No ulceration, no infection noted.  Return Visit-Office Procedure: Patient instructed to return to the office for a follow up visit 3 months for continued evaluation and treatment.  Helane GuntherGregory Riverlyn Kizziah DPM

## 2014-12-28 ENCOUNTER — Ambulatory Visit: Payer: Medicare Other | Admitting: Podiatry

## 2015-02-10 ENCOUNTER — Ambulatory Visit (INDEPENDENT_AMBULATORY_CARE_PROVIDER_SITE_OTHER): Payer: Medicare Other | Admitting: Podiatry

## 2015-02-10 ENCOUNTER — Encounter: Payer: Self-pay | Admitting: Podiatry

## 2015-02-10 DIAGNOSIS — B351 Tinea unguium: Secondary | ICD-10-CM | POA: Diagnosis not present

## 2015-02-10 DIAGNOSIS — M79673 Pain in unspecified foot: Secondary | ICD-10-CM

## 2015-02-10 NOTE — Progress Notes (Signed)
Patient ID: Kimberly Quinn, female   DOB: 05/28/1923, 82 y.o.   MRN: [id number] Complaint:  Visit Type: Patient returns to my office for continued preventative foot care services. Complaint: Patient states" my nails have grown long and thick and become painful to walk and wear shoes" . The patient presents for preventative foot care services. No changes to ROS  Podiatric Exam: Vascular: dorsalis pedis and posterior tibial pulses are not  palpable bilateral. Capillary return is immediate. Cold feet noted.  Sensorium: Normal Semmes Weinstein monofilament test. Normal tactile sensation bilaterally. Nail Exam: Pt has thick disfigured discolored nails with subungual debris noted bilateral entire nail hallux through fifth toenails Ulcer Exam: There is no evidence of ulcer or pre-ulcerative changes or infection. Orthopedic Exam: Muscle tone and strength are WNL. No limitations in general ROM. No crepitus or effusions noted. Foot type and digits show no abnormalities. Bony prominences are unremarkable.Hammer toe second left foot. Skin: No Porokeratosis. No infection or ulcers  Diagnosis:  Onychomycosis, , Pain in right toe, pain in left toes  Treatment & Plan Procedures and Treatment: Consent by patient was obtained for treatment procedures. The patient understood the discussion of treatment and procedures well. All questions were answered thoroughly reviewed. Debridement of mycotic and hypertrophic toenails, 1 through 5 bilateral and clearing of subungual debris. No ulceration, no infection noted.  Return Visit-Office Procedure: Patient instructed to return to the office for a follow up visit 3 months for continued evaluation and treatment.  Hailee Hollick DPM 

## 2015-05-12 ENCOUNTER — Encounter: Payer: Self-pay | Admitting: Podiatry

## 2015-05-12 ENCOUNTER — Ambulatory Visit (INDEPENDENT_AMBULATORY_CARE_PROVIDER_SITE_OTHER): Payer: Medicare Other | Admitting: Podiatry

## 2015-05-12 DIAGNOSIS — M2042 Other hammer toe(s) (acquired), left foot: Secondary | ICD-10-CM

## 2015-05-12 DIAGNOSIS — B351 Tinea unguium: Secondary | ICD-10-CM | POA: Diagnosis not present

## 2015-05-12 DIAGNOSIS — M79673 Pain in unspecified foot: Secondary | ICD-10-CM

## 2015-05-12 NOTE — Progress Notes (Signed)
Patient ID: Kimberly Quinn, female   DOB: 07/23/1923, 82 y.o.   MRN: [id number] Complaint:  Visit Type: Patient returns to my office for continued preventative foot care services. Complaint: Patient states" my nails have grown long and thick and become painful to walk and wear shoes" . The patient presents for preventative foot care services. No changes to ROS  Podiatric Exam: Vascular: dorsalis pedis and posterior tibial pulses are not  palpable bilateral. Capillary return is immediate. Cold feet noted.  Sensorium: Normal Semmes Weinstein monofilament test. Normal tactile sensation bilaterally. Nail Exam: Pt has thick disfigured discolored nails with subungual debris noted bilateral entire nail hallux through fifth toenails Ulcer Exam: There is no evidence of ulcer or pre-ulcerative changes or infection. Orthopedic Exam: Muscle tone and strength are WNL. No limitations in general ROM. No crepitus or effusions noted. Foot type and digits show no abnormalities. Bony prominences are unremarkable.Hammer toe second left foot. Skin: No Porokeratosis. No infection or ulcers  Diagnosis:  Onychomycosis, , Pain in right toe, pain in left toes  Treatment & Plan Procedures and Treatment: Consent by patient was obtained for treatment procedures. The patient understood the discussion of treatment and procedures well. All questions were answered thoroughly reviewed. Debridement of mycotic and hypertrophic toenails, 1 through 5 bilateral and clearing of subungual debris. No ulceration, no infection noted.  Return Visit-Office Procedure: Patient instructed to return to the office for a follow up visit 3 months for continued evaluation and treatment.  Jaymz Traywick DPM 

## 2015-07-21 ENCOUNTER — Ambulatory Visit (INDEPENDENT_AMBULATORY_CARE_PROVIDER_SITE_OTHER): Payer: Medicare Other | Admitting: Podiatry

## 2015-07-21 ENCOUNTER — Encounter: Payer: Self-pay | Admitting: Podiatry

## 2015-07-21 DIAGNOSIS — M79673 Pain in unspecified foot: Secondary | ICD-10-CM | POA: Diagnosis not present

## 2015-07-21 DIAGNOSIS — B351 Tinea unguium: Secondary | ICD-10-CM | POA: Diagnosis not present

## 2015-07-21 DIAGNOSIS — M2042 Other hammer toe(s) (acquired), left foot: Secondary | ICD-10-CM

## 2015-07-21 NOTE — Progress Notes (Signed)
Patient ID: Kimberly Quinn, female   DOB: 1923-09-11, 82 y.o.   MRN: [id number] Complaint:  Visit Type: Patient returns to my office for continued preventative foot care services. Complaint: Patient states" my nails have grown long and thick and become painful to walk and wear shoes" . The patient presents for preventative foot care services. No changes to ROS  Podiatric Exam: Vascular: dorsalis pedis and posterior tibial pulses are not  palpable bilateral. Capillary return is immediate. Cold feet noted.  Sensorium: Normal Semmes Weinstein monofilament test. Normal tactile sensation bilaterally. Nail Exam: Pt has thick disfigured discolored nails with subungual debris noted bilateral entire nail hallux through fifth toenails Ulcer Exam: There is no evidence of ulcer or pre-ulcerative changes or infection. Orthopedic Exam: Muscle tone and strength are WNL. No limitations in general ROM. No crepitus or effusions noted. Foot type and digits show no abnormalities. Bony prominences are unremarkable.Hammer toe second left foot. Skin: No Porokeratosis. No infection or ulcers  Diagnosis:  Onychomycosis, , Pain in right toe, pain in left toes  Treatment & Plan Procedures and Treatment: Consent by patient was obtained for treatment procedures. The patient understood the discussion of treatment and procedures well. All questions were answered thoroughly reviewed. Debridement of mycotic and hypertrophic toenails, 1 through 5 bilateral and clearing of subungual debris. No ulceration, no infection noted.  Return Visit-Office Procedure: Patient instructed to return to the office for a follow up visit 3 months for continued evaluation and treatment.  Helane GuntherGregory Jeanelle Dake DPM

## 2015-09-29 ENCOUNTER — Ambulatory Visit: Payer: Medicare Other | Admitting: Podiatry

## 2016-02-12 IMAGING — MR MR LUMBAR SPINE W/O CM
4 of 6 series · 19 of 48 positions shown · non-contrast
Comparison: Lumbar MRI 03/12/2012

CLINICAL DATA: Low back pain

EXAM:
MRI LUMBAR SPINE WITHOUT CONTRAST
TECHNIQUE: Multiplanar, multisequence MR imaging of the lumbar spine was
performed. No intravenous contrast was administered.

[Series 4: T1 · sagittal · 4.0mm · 0.55mm/px · 3 of 16 slices shown (1 of 2)]
[im 4/16]
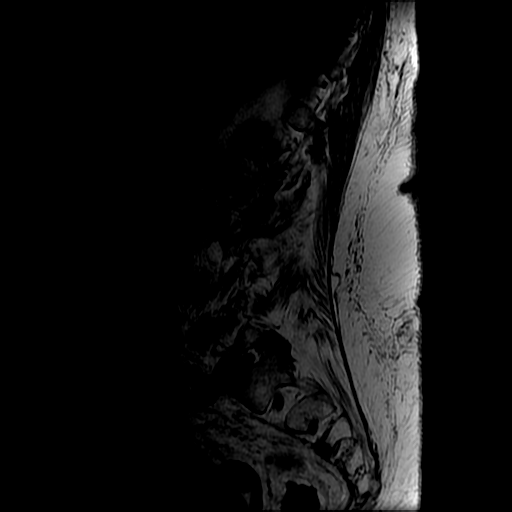
[im 10/16]
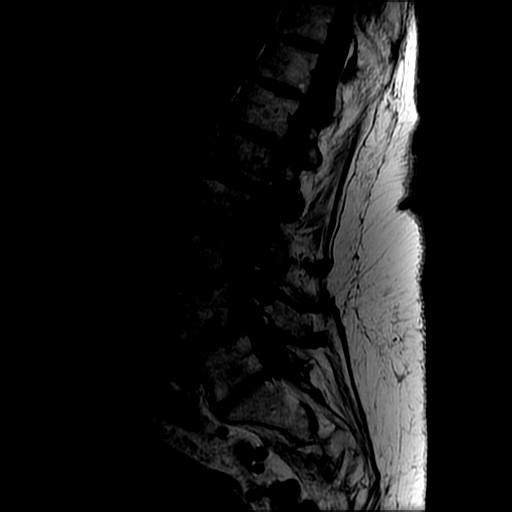
[im 16/16]
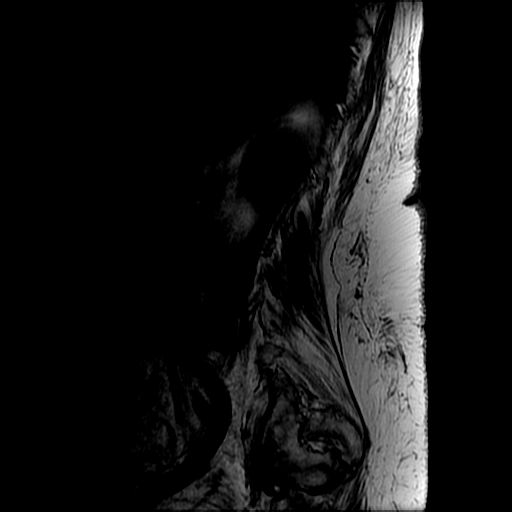

[Series 5: T2 post-contrast · sagittal · 4.0mm · 0.55mm/px · 6 of 16 slices shown]
[im 1/16]
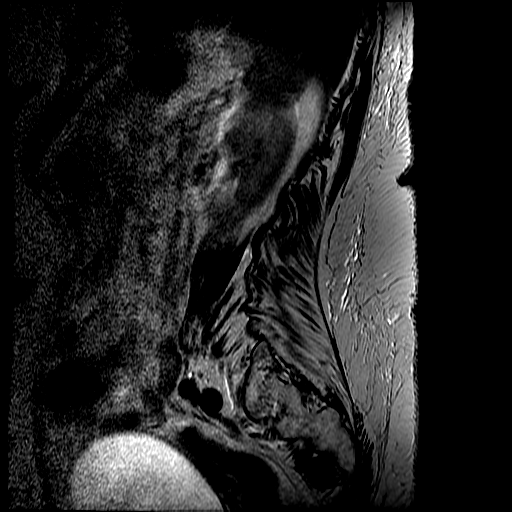
[im 4/16]
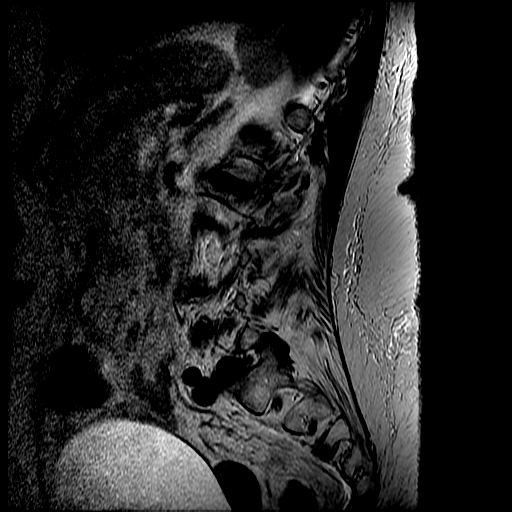
[im 7/16]
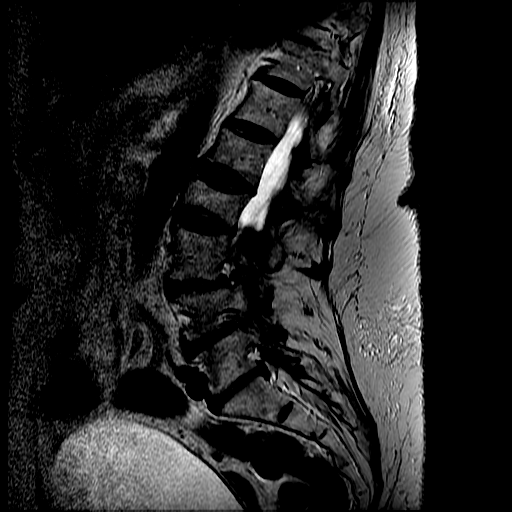
[im 10/16]
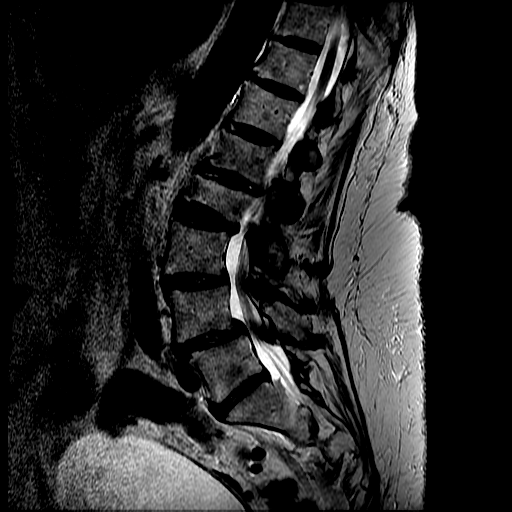
[im 13/16]
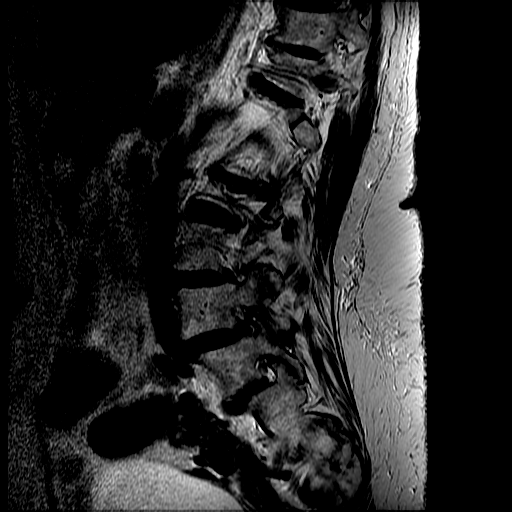
[im 16/16]
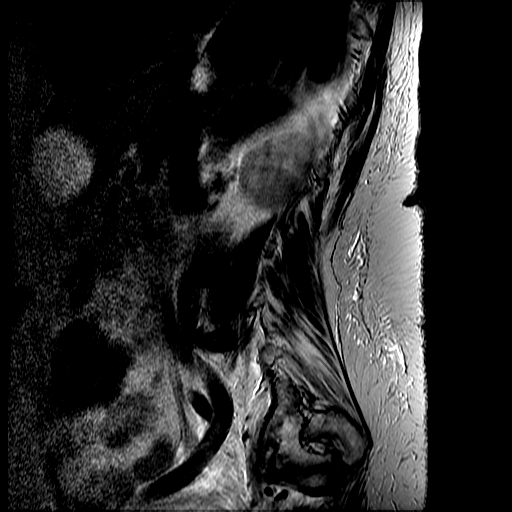

[Series 7: T2 · axial · 4.0mm · 0.39mm/px · z∈[-93,+87]mm · 7 of 38 slices shown]
[im 1/38]
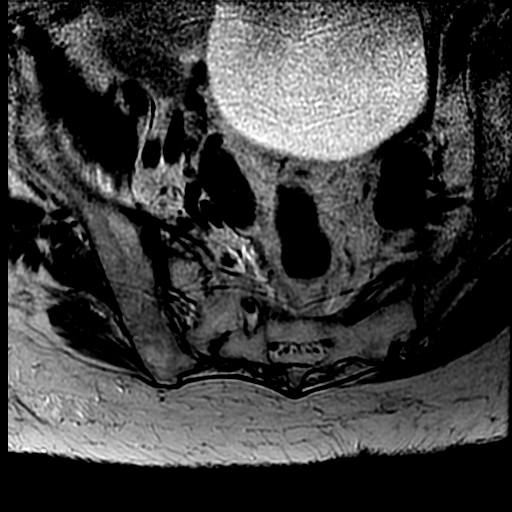
[im 7/38]
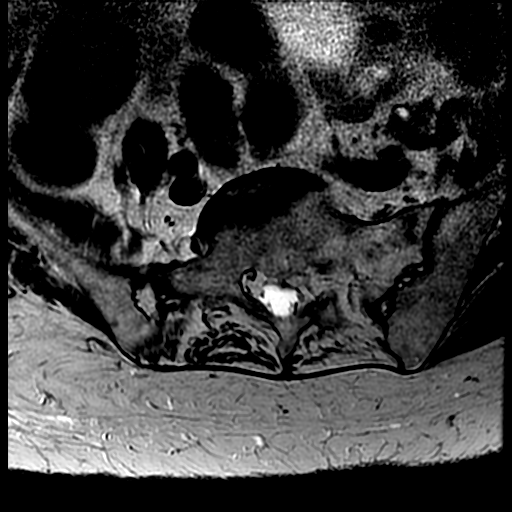
[im 13/38]
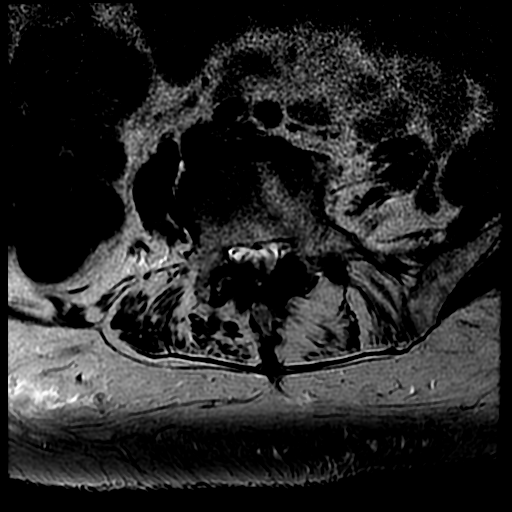
[im 16/38]
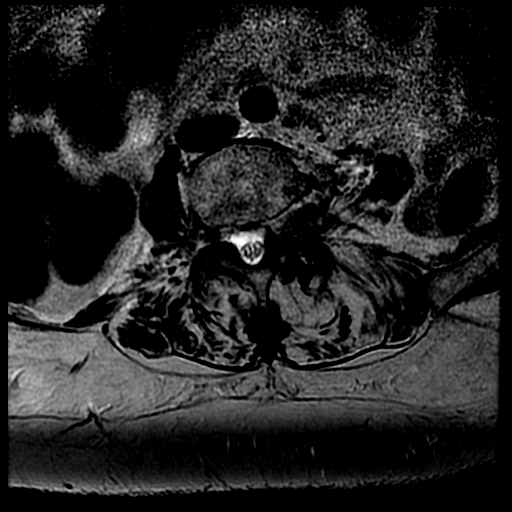
[im 19/38]
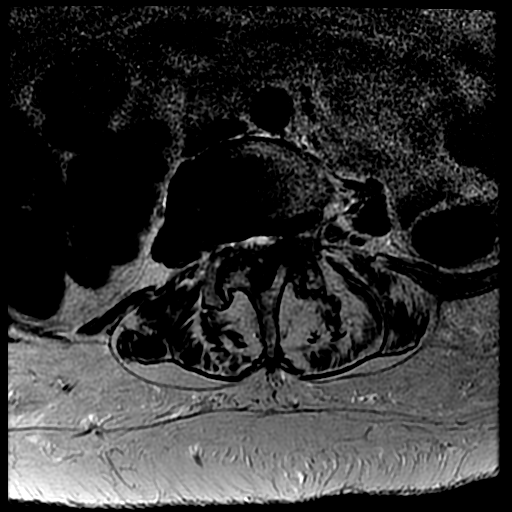
[im 22/38]
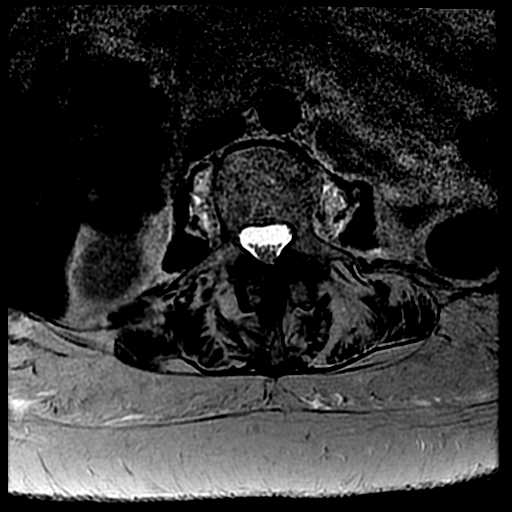
[im 31/38]
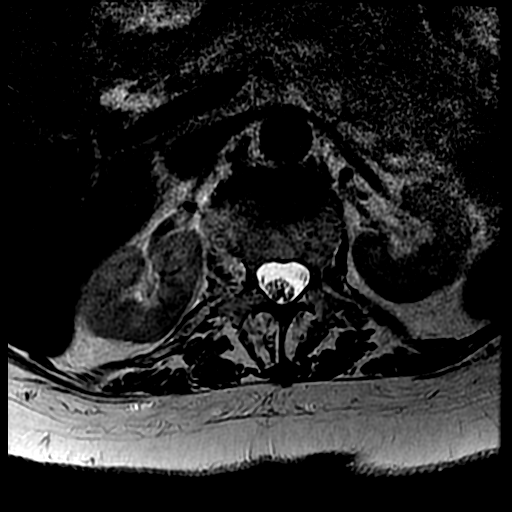

[Series 8: T1 · axial · 4.0mm · 0.39mm/px · z∈[-63,+87]mm · 3 of 38 slices shown (2 of 2)]
[im 7/38]
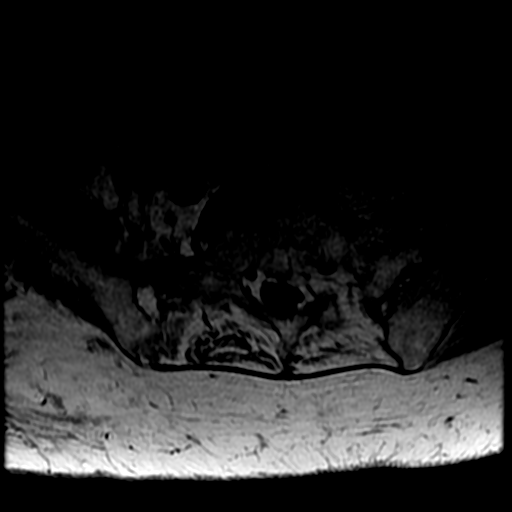
[im 19/38]
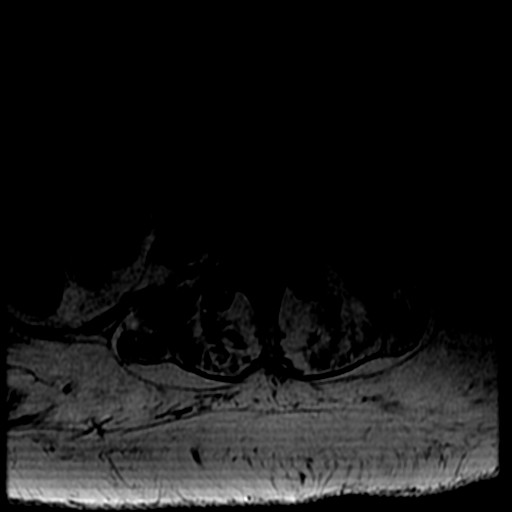
[im 31/38]
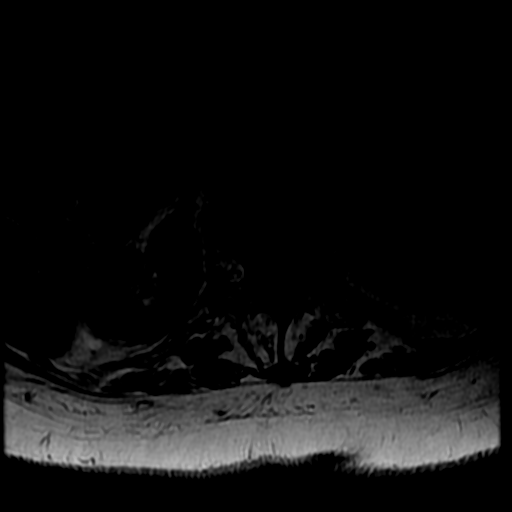

[19 of 48 positions shown; findings below may reference images not displayed]

FINDINGS: Lumbar scoliosis. 3 cm hepatic cyst in the dome of the liver.
Distended urinary bladder.

Conus medullaris is normal and terminates at L1-2. Chronic fracture
L2. No acute fracture or mass.

T12-L1:  Mild disc and facet degeneration without stenosis

L1-2: Disc degeneration and spurring on the right with right
foraminal encroachment. This was present previously. Central canal
widely patent

L2-3: Disc and facet degeneration. Foraminal narrowing bilaterally
due to spurring. Mild narrowing of the canal without significant
spinal stenosis.

L3-4: 3 mm anterior slip. Advanced facet degeneration. Disc
degeneration and spondylosis. Moderately severe spinal stenosis has
progressed. Moderate foraminal encroachment bilaterally similar to
the other study.

L4-5: 5 mm anterior slip. Severe facet degeneration and severe
spinal stenosis. Subarticular and foraminal encroachment
bilaterally.

L5-S1: Disc degeneration and spurring. Left-sided disc or osteophyte
is unchanged from the prior study. Mild left foraminal narrowing is
unchanged.
IMPRESSION: Chronic fracture L2.  No acute fracture.

Right foraminal encroachment due to spurring at L1-2 is unchanged.

Moderately severe spinal stenosis L3-4 with moderate foraminal
encroachment bilaterally

5 mm anterior slip L4 -5 with severe spinal stenosis similar to the
prior study. Subarticular and foraminal encroachment bilaterally.

L5-S1 left foraminal encroachment due to disc and osteophyte. This
is unchanged.

## 2016-04-05 ENCOUNTER — Ambulatory Visit (INDEPENDENT_AMBULATORY_CARE_PROVIDER_SITE_OTHER): Payer: Medicare Other | Admitting: Podiatry

## 2016-04-05 ENCOUNTER — Encounter: Payer: Self-pay | Admitting: Podiatry

## 2016-04-05 DIAGNOSIS — B351 Tinea unguium: Secondary | ICD-10-CM

## 2016-04-05 DIAGNOSIS — M79676 Pain in unspecified toe(s): Secondary | ICD-10-CM

## 2016-04-05 NOTE — Progress Notes (Signed)
Patient ID: NIOMA MCCUBBINS, female   DOB: 06/11/1923, 83 y.o.   MRN: [id number] Complaint:  Visit Type: Patient returns to my office for continued preventative foot care services. Complaint: Patient states" my nails have grown long and thick and become painful to walk and wear shoes" . The patient presents for preventative foot care services. No changes to ROS  Podiatric Exam: Vascular: dorsalis pedis and posterior tibial pulses are not  palpable bilateral. Capillary return is immediate. Cold feet noted.  Sensorium: Normal Semmes Weinstein monofilament test. Normal tactile sensation bilaterally. Nail Exam: Pt has thick disfigured discolored nails with subungual debris noted bilateral entire nail hallux through fifth toenails Ulcer Exam: There is no evidence of ulcer or pre-ulcerative changes or infection. Orthopedic Exam: Muscle tone and strength are WNL. No limitations in general ROM. No crepitus or effusions noted. Foot type and digits show no abnormalities. Bony prominences are unremarkable.Hammer toe second left foot. Skin: No Porokeratosis. No infection or ulcers  Diagnosis:  Onychomycosis, , Pain in right toe, pain in left toes  Treatment & Plan Procedures and Treatment: Consent by patient was obtained for treatment procedures. The patient understood the discussion of treatment and procedures well. All questions were answered thoroughly reviewed. Debridement of mycotic and hypertrophic toenails, 1 through 5 bilateral and clearing of subungual debris. No ulceration, no infection noted.  Return Visit-Office Procedure: Patient instructed to return to the office for a follow up visit 3 months for continued evaluation and treatment.  Helane Gunther DPM

## 2016-07-11 ENCOUNTER — Encounter: Payer: Self-pay | Admitting: Podiatry

## 2016-07-11 ENCOUNTER — Ambulatory Visit (INDEPENDENT_AMBULATORY_CARE_PROVIDER_SITE_OTHER): Payer: Medicare Other | Admitting: Podiatry

## 2016-07-11 DIAGNOSIS — Q828 Other specified congenital malformations of skin: Secondary | ICD-10-CM

## 2016-07-11 DIAGNOSIS — B351 Tinea unguium: Secondary | ICD-10-CM

## 2016-07-11 DIAGNOSIS — M79676 Pain in unspecified toe(s): Secondary | ICD-10-CM | POA: Diagnosis not present

## 2016-07-11 NOTE — Progress Notes (Signed)
Patient ID: Kimberly GilmoreFrances C Quinn, female   DOB: 06-27-1923, 83 y.o.   MRN: [id number] Complaint:  Visit Type: Patient returns to my office for continued preventative foot care services. Complaint: Patient states" my nails have grown long and thick and become painful to walk and wear shoes" . The patient presents for preventative foot care services. No changes to ROS  Podiatric Exam: Vascular: dorsalis pedis and posterior tibial pulses are not  palpable bilateral. Capillary return is immediate. Cold feet noted.  Sensorium: Normal Semmes Weinstein monofilament test. Normal tactile sensation bilaterally. Nail Exam: Pt has thick disfigured discolored nails with subungual debris noted bilateral entire nail hallux through fifth toenails Ulcer Exam: There is no evidence of ulcer or pre-ulcerative changes or infection. Orthopedic Exam: Muscle tone and strength are WNL. No limitations in general ROM. No crepitus or effusions noted. Foot type and digits show no abnormalities. Bony prominences are unremarkable.Hammer toe second left foot. Skin: Porokeratosis sub 1 right foot. No infection or ulcers  Diagnosis:  Onychomycosis, , Pain in right toe, pain in left toes,  Porokeratosis right foot. Treatment & Plan Procedures and Treatment: Consent by patient was obtained for treatment procedures. The patient understood the discussion of treatment and procedures well. All questions were answered thoroughly reviewed. Debridement of mycotic and hypertrophic toenails, 1 through 5 bilateral and clearing of subungual debris. No ulceration, no infection noted. Debride porokeratosis right Return Visit-Office Procedure: Patient instructed to return to the office for a follow up visit 3 months for continued evaluation and treatment.  Helane GuntherGregory Juron Vorhees DPM

## 2016-09-05 ENCOUNTER — Encounter (HOSPITAL_BASED_OUTPATIENT_CLINIC_OR_DEPARTMENT_OTHER): Payer: Medicare Other | Attending: Surgery

## 2016-09-05 DIAGNOSIS — L97812 Non-pressure chronic ulcer of other part of right lower leg with fat layer exposed: Secondary | ICD-10-CM | POA: Diagnosis not present

## 2016-09-05 DIAGNOSIS — Z79899 Other long term (current) drug therapy: Secondary | ICD-10-CM | POA: Insufficient documentation

## 2016-09-05 DIAGNOSIS — I10 Essential (primary) hypertension: Secondary | ICD-10-CM | POA: Diagnosis not present

## 2016-09-05 DIAGNOSIS — I890 Lymphedema, not elsewhere classified: Secondary | ICD-10-CM | POA: Diagnosis present

## 2016-09-05 DIAGNOSIS — I739 Peripheral vascular disease, unspecified: Secondary | ICD-10-CM | POA: Diagnosis not present

## 2016-09-05 DIAGNOSIS — L97212 Non-pressure chronic ulcer of right calf with fat layer exposed: Secondary | ICD-10-CM | POA: Diagnosis not present

## 2016-09-05 DIAGNOSIS — Z7982 Long term (current) use of aspirin: Secondary | ICD-10-CM | POA: Diagnosis not present

## 2016-09-05 DIAGNOSIS — L97222 Non-pressure chronic ulcer of left calf with fat layer exposed: Secondary | ICD-10-CM | POA: Diagnosis not present

## 2016-09-12 DIAGNOSIS — I890 Lymphedema, not elsewhere classified: Secondary | ICD-10-CM | POA: Diagnosis not present

## 2016-10-10 ENCOUNTER — Encounter (HOSPITAL_BASED_OUTPATIENT_CLINIC_OR_DEPARTMENT_OTHER): Payer: Medicare Other | Attending: Surgery

## 2016-10-10 DIAGNOSIS — I10 Essential (primary) hypertension: Secondary | ICD-10-CM | POA: Diagnosis not present

## 2016-10-10 DIAGNOSIS — L97812 Non-pressure chronic ulcer of other part of right lower leg with fat layer exposed: Secondary | ICD-10-CM | POA: Insufficient documentation

## 2016-10-17 ENCOUNTER — Ambulatory Visit: Payer: Medicare Other | Admitting: Podiatry

## 2016-10-24 DIAGNOSIS — L97812 Non-pressure chronic ulcer of other part of right lower leg with fat layer exposed: Secondary | ICD-10-CM | POA: Diagnosis not present

## 2016-11-07 ENCOUNTER — Encounter (HOSPITAL_BASED_OUTPATIENT_CLINIC_OR_DEPARTMENT_OTHER): Payer: Medicare Other | Attending: Surgery

## 2016-11-07 DIAGNOSIS — L97812 Non-pressure chronic ulcer of other part of right lower leg with fat layer exposed: Secondary | ICD-10-CM | POA: Insufficient documentation

## 2016-11-07 DIAGNOSIS — I890 Lymphedema, not elsewhere classified: Secondary | ICD-10-CM | POA: Insufficient documentation

## 2016-11-07 DIAGNOSIS — I10 Essential (primary) hypertension: Secondary | ICD-10-CM | POA: Diagnosis not present

## 2016-11-12 ENCOUNTER — Other Ambulatory Visit: Payer: Self-pay

## 2016-11-12 ENCOUNTER — Encounter (HOSPITAL_COMMUNITY): Payer: Self-pay | Admitting: Emergency Medicine

## 2016-11-12 ENCOUNTER — Inpatient Hospital Stay (HOSPITAL_COMMUNITY)
Admission: EM | Admit: 2016-11-12 | Discharge: 2016-11-17 | DRG: 683 | Disposition: A | Discharge status: Skilled Nursing Facility | Payer: Medicare Other | Attending: Internal Medicine | Admitting: Internal Medicine

## 2016-11-12 ENCOUNTER — Emergency Department (HOSPITAL_COMMUNITY): Payer: Medicare Other

## 2016-11-12 DIAGNOSIS — E872 Acidosis, unspecified: Secondary | ICD-10-CM

## 2016-11-12 DIAGNOSIS — E162 Hypoglycemia, unspecified: Secondary | ICD-10-CM | POA: Diagnosis present

## 2016-11-12 DIAGNOSIS — E860 Dehydration: Secondary | ICD-10-CM | POA: Diagnosis present

## 2016-11-12 DIAGNOSIS — L03115 Cellulitis of right lower limb: Secondary | ICD-10-CM

## 2016-11-12 DIAGNOSIS — R531 Weakness: Secondary | ICD-10-CM

## 2016-11-12 DIAGNOSIS — E876 Hypokalemia: Secondary | ICD-10-CM | POA: Diagnosis present

## 2016-11-12 DIAGNOSIS — S81801A Unspecified open wound, right lower leg, initial encounter: Secondary | ICD-10-CM | POA: Diagnosis not present

## 2016-11-12 DIAGNOSIS — Z87891 Personal history of nicotine dependence: Secondary | ICD-10-CM

## 2016-11-12 DIAGNOSIS — E875 Hyperkalemia: Secondary | ICD-10-CM | POA: Diagnosis present

## 2016-11-12 DIAGNOSIS — I10 Essential (primary) hypertension: Secondary | ICD-10-CM | POA: Diagnosis present

## 2016-11-12 DIAGNOSIS — D509 Iron deficiency anemia, unspecified: Secondary | ICD-10-CM | POA: Diagnosis present

## 2016-11-12 DIAGNOSIS — R197 Diarrhea, unspecified: Secondary | ICD-10-CM

## 2016-11-12 DIAGNOSIS — N390 Urinary tract infection, site not specified: Secondary | ICD-10-CM | POA: Diagnosis not present

## 2016-11-12 DIAGNOSIS — N3000 Acute cystitis without hematuria: Secondary | ICD-10-CM

## 2016-11-12 DIAGNOSIS — L97919 Non-pressure chronic ulcer of unspecified part of right lower leg with unspecified severity: Secondary | ICD-10-CM | POA: Diagnosis present

## 2016-11-12 DIAGNOSIS — D72829 Elevated white blood cell count, unspecified: Secondary | ICD-10-CM | POA: Diagnosis not present

## 2016-11-12 DIAGNOSIS — B961 Klebsiella pneumoniae [K. pneumoniae] as the cause of diseases classified elsewhere: Secondary | ICD-10-CM | POA: Diagnosis present

## 2016-11-12 DIAGNOSIS — N179 Acute kidney failure, unspecified: Secondary | ICD-10-CM | POA: Diagnosis present

## 2016-11-12 DIAGNOSIS — L8990 Pressure ulcer of unspecified site, unspecified stage: Secondary | ICD-10-CM

## 2016-11-12 DIAGNOSIS — Z7982 Long term (current) use of aspirin: Secondary | ICD-10-CM | POA: Diagnosis not present

## 2016-11-12 DIAGNOSIS — R21 Rash and other nonspecific skin eruption: Secondary | ICD-10-CM | POA: Diagnosis present

## 2016-11-12 DIAGNOSIS — E878 Other disorders of electrolyte and fluid balance, not elsewhere classified: Secondary | ICD-10-CM | POA: Diagnosis present

## 2016-11-12 DIAGNOSIS — S81801D Unspecified open wound, right lower leg, subsequent encounter: Secondary | ICD-10-CM | POA: Diagnosis not present

## 2016-11-12 LAB — URINALYSIS, ROUTINE W REFLEX MICROSCOPIC
BILIRUBIN URINE: NEGATIVE
Glucose, UA: NEGATIVE mg/dL
HGB URINE DIPSTICK: NEGATIVE
Ketones, ur: NEGATIVE mg/dL
Nitrite: NEGATIVE
Protein, ur: NEGATIVE mg/dL
SPECIFIC GRAVITY, URINE: 1.016 (ref 1.005–1.030)
pH: 5 (ref 5.0–8.0)

## 2016-11-12 LAB — IRON AND TIBC
Iron: 14 ug/dL — ABNORMAL LOW (ref 28–170)
Saturation Ratios: 8 % — ABNORMAL LOW (ref 10.4–31.8)
TIBC: 183 ug/dL — ABNORMAL LOW (ref 250–450)
UIBC: 169 ug/dL

## 2016-11-12 LAB — CBC
HEMATOCRIT: 30.7 % — AB (ref 36.0–46.0)
Hemoglobin: 9.9 g/dL — ABNORMAL LOW (ref 12.0–15.0)
MCH: 24.4 pg — ABNORMAL LOW (ref 26.0–34.0)
MCHC: 32.2 g/dL (ref 30.0–36.0)
MCV: 75.6 fL — AB (ref 78.0–100.0)
PLATELETS: 511 10*3/uL — AB (ref 150–400)
RBC: 4.06 MIL/uL (ref 3.87–5.11)
RDW: 17.7 % — AB (ref 11.5–15.5)
WBC: 23.3 10*3/uL — ABNORMAL HIGH (ref 4.0–10.5)

## 2016-11-12 LAB — I-STAT CG4 LACTIC ACID, ED: LACTIC ACID, VENOUS: 0.72 mmol/L (ref 0.5–1.9)

## 2016-11-12 LAB — I-STAT TROPONIN, ED: TROPONIN I, POC: 0.04 ng/mL (ref 0.00–0.08)

## 2016-11-12 LAB — COMPREHENSIVE METABOLIC PANEL
ALT: 24 U/L (ref 14–54)
AST: 29 U/L (ref 15–41)
Albumin: 2.5 g/dL — ABNORMAL LOW (ref 3.5–5.0)
Alkaline Phosphatase: 133 U/L — ABNORMAL HIGH (ref 38–126)
Anion gap: 11 (ref 5–15)
BILIRUBIN TOTAL: 0.8 mg/dL (ref 0.3–1.2)
BUN: 97 mg/dL — AB (ref 6–20)
CHLORIDE: 107 mmol/L (ref 101–111)
CO2: 18 mmol/L — ABNORMAL LOW (ref 22–32)
CREATININE: 2.6 mg/dL — AB (ref 0.44–1.00)
Calcium: 8.5 mg/dL — ABNORMAL LOW (ref 8.9–10.3)
GFR calc non Af Amer: 15 mL/min — ABNORMAL LOW (ref >60)
GFR, EST AFRICAN AMERICAN: 17 mL/min — AB (ref >60)
Glucose, Bld: 58 mg/dL — ABNORMAL LOW (ref 65–99)
POTASSIUM: 3.7 mmol/L (ref 3.5–5.1)
Sodium: 136 mmol/L (ref 135–145)
Total Protein: 6.5 g/dL (ref 6.5–8.1)

## 2016-11-12 LAB — GLUCOSE, CAPILLARY
GLUCOSE-CAPILLARY: 108 mg/dL — AB (ref 65–99)
GLUCOSE-CAPILLARY: 120 mg/dL — AB (ref 65–99)
GLUCOSE-CAPILLARY: 120 mg/dL — AB (ref 65–99)
GLUCOSE-CAPILLARY: 135 mg/dL — AB (ref 65–99)

## 2016-11-12 LAB — CBG MONITORING, ED
GLUCOSE-CAPILLARY: 87 mg/dL (ref 65–99)
Glucose-Capillary: 55 mg/dL — ABNORMAL LOW (ref 65–99)
Glucose-Capillary: 64 mg/dL — ABNORMAL LOW (ref 65–99)

## 2016-11-12 LAB — MRSA PCR SCREENING: MRSA by PCR: NEGATIVE

## 2016-11-12 LAB — TSH: TSH: 0.88 u[IU]/mL (ref 0.350–4.500)

## 2016-11-12 MED ORDER — ACETAMINOPHEN 325 MG PO TABS
650.0000 mg | ORAL_TABLET | Freq: Four times a day (QID) | ORAL | Status: DC | PRN
  Administered 2016-11-12 – 2016-11-15 (×4): 650 mg via ORAL
  Filled 2016-11-12 (×4): qty 2

## 2016-11-12 MED ORDER — ONDANSETRON HCL 4 MG/2ML IJ SOLN: 4.0000 mg | Freq: Four times a day (QID) | INTRAMUSCULAR | Status: DC | PRN

## 2016-11-12 MED ORDER — ONDANSETRON HCL 4 MG PO TABS: 4.0000 mg | ORAL_TABLET | Freq: Four times a day (QID) | ORAL | Status: DC | PRN

## 2016-11-12 MED ORDER — DEXTROSE-NACL 5-0.45 % IV SOLN
INTRAVENOUS | Status: DC
  Administered 2016-11-12 – 2016-11-13 (×2): via INTRAVENOUS

## 2016-11-12 MED ORDER — SODIUM CHLORIDE 0.9 % IV SOLN
1.5000 g | Freq: Once | INTRAVENOUS | Status: AC
  Administered 2016-11-12: 1.5 g via INTRAVENOUS
  Filled 2016-11-12: qty 1.5

## 2016-11-12 MED ORDER — SODIUM CHLORIDE 0.9 % IV BOLUS (SEPSIS): 500.0000 mL | Freq: Once | INTRAVENOUS | Status: DC

## 2016-11-12 MED ORDER — SODIUM CHLORIDE 0.9 % IV SOLN
1.5000 g | Freq: Two times a day (BID) | INTRAVENOUS | Status: DC
  Administered 2016-11-12 – 2016-11-14 (×4): 1.5 g via INTRAVENOUS
  Filled 2016-11-12 (×5): qty 1.5

## 2016-11-12 MED ORDER — WHITE PETROLATUM EX OINT
TOPICAL_OINTMENT | CUTANEOUS | Status: AC
  Administered 2016-11-12: 1
  Filled 2016-11-12: qty 28.35

## 2016-11-12 MED ORDER — IPRATROPIUM-ALBUTEROL 0.5-2.5 (3) MG/3ML IN SOLN: 3.0000 mL | Freq: Four times a day (QID) | RESPIRATORY_TRACT | Status: DC | PRN

## 2016-11-12 MED ORDER — DEXTROSE 50 % IV SOLN
1.0000 | Freq: Once | INTRAVENOUS | Status: AC
  Administered 2016-11-12: 50 mL via INTRAVENOUS
  Filled 2016-11-12: qty 50

## 2016-11-12 MED ORDER — COLLAGENASE 250 UNIT/GM EX OINT
TOPICAL_OINTMENT | Freq: Every day | CUTANEOUS | Status: DC
  Administered 2016-11-12: 12:00:00 via TOPICAL
  Administered 2016-11-13: 1 via TOPICAL
  Administered 2016-11-14 – 2016-11-17 (×4): via TOPICAL
  Filled 2016-11-12 (×2): qty 30

## 2016-11-12 MED ORDER — ORAL CARE MOUTH RINSE
15.0000 mL | Freq: Two times a day (BID) | OROMUCOSAL | Status: DC
  Administered 2016-11-12 – 2016-11-16 (×7): 15 mL via OROMUCOSAL

## 2016-11-12 MED ORDER — SODIUM CHLORIDE 0.9 % IV BOLUS (SEPSIS)
1000.0000 mL | Freq: Once | INTRAVENOUS | Status: AC
  Administered 2016-11-12: 1000 mL via INTRAVENOUS

## 2016-11-12 MED ORDER — ACETAMINOPHEN 650 MG RE SUPP: 650.0000 mg | Freq: Four times a day (QID) | RECTAL | Status: DC | PRN

## 2016-11-12 MED ORDER — BACID PO TABS
2.0000 | ORAL_TABLET | Freq: Three times a day (TID) | ORAL | Status: DC
  Administered 2016-11-12 – 2016-11-17 (×12): 2 via ORAL
  Filled 2016-11-12 (×19): qty 2

## 2016-11-12 NOTE — Progress Notes (Signed)
[facility]  Patient Status: Active (receiving services up to time of hospitalization)  [facility] is providing the following services: RN  If patient discharges after hours, please call [phone].   Kizzie FurnishDonna Fellmy 11/12/2016, 11:29 AM

## 2016-11-12 NOTE — ED Provider Notes (Signed)
[facility] EMERGENCY DEPARTMENT Provider Note   CSN: [id number] Arrival date & time: 11/12/16  0103    History   Chief Complaint Chief Complaint  Patient presents with  . Altered Mental Status    HPI Kimberly Quinn is a 83 y.o. female.  83 year old female with a history of hypertension presents to the emergency department for evaluation of increased weakness and fatigue.  Husband states that patient has had anorexia over the past 3 days with difficulty getting out of her motorized chair.  He states that he was trying to get the patient up for 1.5 hours, unsuccessfully.  At that time, husband called EMS. Husband states that patient has been sleeping more over the past 2 days.  He denies any known recent fever.  Patient, herself, has no complaints.  She denies headache, chest pain, abdominal pain, nausea, vomiting, shortness of breath.  Patient also without complaints of numbness or tingling in her arms or legs.  No reported falls recently, and has been reports that the patient has not been ambulatory in the past 3 days secondary to complaints of right leg pain where she has a chronic wound.  She is followed by a wound care doctor and home health aide 3 times a week for this.      Past Medical History:  Diagnosis Date  . Hypertension     Patient Active Problem List   Diagnosis Date Noted  . Hyponatremia 12/31/2013  . Low back pain 12/31/2013  . Normocytic normochromic anemia 12/31/2013  . Lung nodule   . Colitis 11/22/2011  . HTN (hypertension) 11/22/2011  . Abdominal pain 11/22/2011    Past Surgical History:  Procedure Laterality Date  . APPENDECTOMY    . KNEE SURGERY    . TONSILLECTOMY      OB History    No data available       Home Medications    Prior to Admission medications   Medication Sig Start Date End Date Taking? Authorizing Provider  acetaminophen (TYLENOL) 325 MG tablet Take 2 tablets (650 mg total) by mouth every 6 (six) hours  as needed for mild pain (or Fever >/= 101). 12/31/13  Yes Alison Murray, MD  acetaminophen (TYLENOL) 650 MG CR tablet Take 1,300 mg every morning by mouth.   Yes [provider]  aspirin EC 81 MG tablet Take 81 mg by mouth every other day.   Yes [provider]  cholecalciferol (VITAMIN D) 1000 UNITS tablet Take 2,000 Units 2 (two) times daily by mouth.    Yes [provider]  CRANBERRY PO Take 1 tablet by mouth daily with lunch.   Yes [provider]  hydrochlorothiazide (HYDRODIURIL) 25 MG tablet Take 25 mg daily by mouth.   Yes [provider]  HYDROcodone-acetaminophen (NORCO/VICODIN) 5-325 MG per tablet Take 1-2 tablets by mouth every 12 (twelve) hours as needed for moderate pain. 12/31/13  Yes Alison Murray, MD  loratadine (CLARITIN) 10 MG tablet Take 10 mg by mouth daily as needed for allergies. For allergies   Yes [provider]  losartan (COZAAR) 100 MG tablet Take 100 mg daily by mouth.   Yes [provider]  Omega-3 Fatty Acids (FISH OIL) 1200 MG CAPS Take 1,200 mg every morning by mouth.    Yes [provider]  omeprazole (PRILOSEC) 20 MG capsule Take 20 mg daily by mouth.   Yes [provider]  ondansetron (ZOFRAN) 4 MG tablet Take 1 tablet (4 mg  total) by mouth every 6 (six) hours as needed for nausea. 12/31/13  Yes Alison Murrayevine, Alma M, MD  Propylene Glycol (SYSTANE BALANCE OP) Apply 1 drop 4 (four) times daily as needed to eye (dry eyes).    Yes [provider]  ranitidine (ZANTAC) 150 MG capsule Take 150 mg by mouth 2 (two) times daily.   Yes [provider]  sertraline (ZOLOFT) 25 MG tablet Take 25 mg daily by mouth.   Yes [provider]  tamsulosin (FLOMAX) 0.4 MG CAPS capsule Take 0.4 mg by mouth.   Yes [provider]    Family History Family History  Problem Relation Age of Onset  . Lung cancer Father     Social History Social History   Tobacco Use  .  Smoking status: Former Smoker    Last attempt to quit: 01/01/1958    Years since quitting: 58.9  . Smokeless tobacco: Never Used  Substance Use Topics  . Alcohol use: No  . Drug use: No     Allergies   Amoxicillin-pot clavulanate; Cephalexin; Ciprofloxacin; Levofloxacin; and Sulfamethoxazole-trimethoprim   Review of Systems Review of Systems Ten systems reviewed and are negative for acute change, except as noted in the HPI.    Physical Exam Updated Vital Signs BP (!) 130/34   Pulse 75   Temp 97.7 F (36.5 C) (Oral)   Resp 18   Ht 5\' 3"  (1.6 m)   Wt 59 kg (130 lb)   SpO2 97%   BMI 23.03 kg/m   Physical Exam  Constitutional: She is oriented to person, place, and time. She appears well-developed and well-nourished. No distress.  Nontoxic appearing and in NAD  HENT:  Head: Normocephalic and atraumatic.  Mildly dry mm  Eyes: Conjunctivae and EOM are normal. No scleral icterus.  Neck: Normal range of motion.  Moving freely.  Cardiovascular: Normal rate, regular rhythm and intact distal pulses.  Pulmonary/Chest: Effort normal. No stridor. No respiratory distress. She has no wheezes.  Lungs CTAB. Respirations even and unlabored.  Abdominal: Soft. She exhibits no distension. There is no tenderness.  Soft, nontender abdomen.  Musculoskeletal: Normal range of motion.  Wrapped wound to RLE.  Neurological: She is alert and oriented to person, place, and time. She exhibits normal muscle tone. Coordination normal.  Alert to self, season; knows President. Hx dementia. GCS 15. Speech is clear, goal oriented. No focal deficits appreciated on exam; moving extremities without ataxia.  Skin: Skin is warm and dry. No rash noted. She is not diaphoretic. No erythema. No pallor.  Psychiatric: She has a normal mood and affect. Her behavior is normal.  Nursing note and vitals reviewed.   Kathreen Devoid^horizontal image of medial RLE   [employer]^horizontal image of lateral RLE    ED Treatments / Results    Labs (all labs ordered are listed, but only abnormal results are displayed) Labs Reviewed  COMPREHENSIVE METABOLIC PANEL - Abnormal; Notable for the following components:      Result Value   CO2 18 (*)    Glucose, Bld 58 (*)    BUN 97 (*)    Creatinine, Ser 2.60 (*)    Calcium 8.5 (*)    Albumin 2.5 (*)    Alkaline Phosphatase 133 (*)    GFR calc non Af Amer 15 (*)    GFR calc Af Amer 17 (*)    All other components within normal limits  CBC - Abnormal; Notable for the following components:   WBC 23.3 (*)  Hemoglobin 9.9 (*)    HCT 30.7 (*)    MCV 75.6 (*)    MCH 24.4 (*)    RDW 17.7 (*)    Platelets 511 (*)    All other components within normal limits  URINALYSIS, ROUTINE W REFLEX MICROSCOPIC - Abnormal; Notable for the following components:   APPearance HAZY (*)    Leukocytes, UA MODERATE (*)    Bacteria, UA MANY (*)    Squamous Epithelial / LPF 0-5 (*)    All other components within normal limits  CBG MONITORING, ED - Abnormal; Notable for the following components:   Glucose-Capillary 55 (*)    All other components within normal limits  CBG MONITORING, ED - Abnormal; Notable for the following components:   Glucose-Capillary 64 (*)    All other components within normal limits  URINE CULTURE  CULTURE, BLOOD (ROUTINE X 2)  CULTURE, BLOOD (ROUTINE X 2)  I-STAT TROPONIN, ED  I-STAT CG4 LACTIC ACID, ED  CBG MONITORING, ED    EKG  EKG Interpretation  Date/Time:  Monday November 12 2016 01:11:29 EST Ventricular Rate:  79 PR Interval:    QRS Duration: 111 QT Interval:  456 QTC Calculation: 523 R Axis:   36 Text Interpretation:  Sinus rhythm Ventricular trigeminy Borderline ST depression, diffuse leads Prolonged QT interval Confirmed by Geoffery Lyons ([id number]) on 11/12/2016 3:35:46 AM       Radiology Dg Chest 2 View  Result Date: 11/12/2016 CLINICAL DATA:  83 year old female with altered mental status and weakness. EXAM: CHEST  2 VIEW COMPARISON:  Chest  radiograph dated 12/31/2013 FINDINGS: There is no focal consolidation, pleural effusion, or pneumothorax. Stable cardiomegaly. There is atherosclerotic calcification of the aortic arch. Osteopenia with chronic elevation of the shoulders indicative of chronic rotator cuff injury. Apparent focal discontinuity of the right posterior fifth rib may be artifactual. Correlation with clinical exam and point tenderness recommended to exclude an acute fracture. IMPRESSION: 1. No acute cardiopulmonary process. 2. Osteopenia. Apparent discontinuity of the right posterior fifth rib. Clinical correlation is recommended to exclude an acute fracture. Electronically Signed   By: Elgie Collard M.D.   On: 11/12/2016 02:24    Procedures Procedures (including critical care time)  Medications Ordered in ED Medications  dextrose 50 % solution 50 mL (not administered)  ampicillin-sulbactam (UNASYN) 1.5 g in sodium chloride 0.9 % 50 mL IVPB (not administered)  lactobacillus acidophilus (BACID) tablet 2 tablet (not administered)  dextrose 50 % solution 50 mL (50 mLs Intravenous Given 11/12/16 0154)  sodium chloride 0.9 % bolus 1,000 mL (0 mLs Intravenous Stopped 11/12/16 0422)    5:16 AM Case discussed with pharmacist.  Given that Augmentin is an intolerance rather than a true allergy, he recommends coverage with IV Unasyn with addition of a probiotic.  This can be transitioned to oral amoxicillin if culture sensitivity allows.  Additional amp of D50 ordered for persistent hypoglycemia.  Order also placed for PO challenge with juice; patient previously tolerating water.  5:46 AM Case discussed with Dr. Katrinka Blazing who will admit.   Initial Impression / Assessment and Plan / ED Course  I have reviewed the triage vital signs and the nursing notes.  Pertinent labs & imaging results that were available during my care of the patient were reviewed by me and considered in my medical decision making (see chart for details).      83 year old female presents to the emergency department for evaluation of generalized weakness and malaise.  She was found to be  afebrile in the emergency department with hypoglycemia to 55.  Husband states that patient has had anorexia with decreased oral intake over the past 2-3 days.  She was found to have a leukocytosis of 23.3.  She is mildly anemic from baseline with new evidence of acute renal failure.  Infectious etiology is unclear, though urinalysis does show pyuria and bacteriuria.  Patient does not currently meet SIRS or SEPSIS criteria and lactate is reassuring. Urine culture ordered.  Plan for admission for management of acute renal failure and persistent hypoglycemia. Patient started on IV Unasyn for suspected UTI. Case discussed [facility] will [facility].   Vitals:   11/12/16 0345 11/12/16 0400 11/12/16 0415 11/12/16 0430  BP: (!) 104/33 (!) 136/33 (!) 137/30 (!) 130/34  Pulse: 73 76 74 75  Resp: 15 17 (!) 21 18  Temp:      TempSrc:      SpO2: 97% 98% 96% 97%  Weight:      Height:        Final Clinical Impressions(s) / ED Diagnoses   Final diagnoses:  Acute cystitis without hematuria  AKI (acute kidney injury) (HCC)  Hypoglycemia  Generalized weakness    ED Discharge Orders    None       Antony MaduraHumes, Georgie Eduardo, PA-C 11/12/16 0550    Geoffery Lyonselo, Douglas, MD 11/12/16 [phone]

## 2016-11-12 NOTE — H&P (Signed)
History and Physical    Kimberly GilmoreFrances C Quinn ZOX:[id number] DOB: 10-30-1923 DOA: 11/12/2016  Referring MD/NP/PA: Antony MaduraKelly Humes PA-C PCP: Lupita RaiderShaw, [name], MD  Patient coming from: Home via EMS  Chief Complaint: Couldn't get out of motorized chair  HPI: Kimberly Quinn is a 83 y.o. female with medical history significant of HTN; who presents after husband notes that he was unable to get the patient out of her motorized chair after trying for 1.5 hours. The patient and her husband provide history The patient normally gets around with a motorized chair, but is usually able to assist in transfers and ambulate short distances. He notes that she is not had much of an appetite and has only been eating a few bites of food here and there for the last 3 days.  Patient reports that she is not hungry and has no appetite.  Husband notes that the patient's been more lethargic, generalized weakness.  Patient denies having any complaints except for right lower extremity wound and pain that has been present since July.  Husband notes that the rash is getting larger, but home health care has been coming out and attending to the wound 3 times a week.  Patient denies having any chest pain, shortness of breath, nausea, vomiting, and diarrhea.   ED Course: On admission into the emergency department patient was noted to be afebrile, pulses 70-81, respirations 13-25, blood pressure 99/37-140/27, and O2 saturations maintained on room air.  Labs revealed WBC 23.3, hemoglobin 9.9, BUN 97, creatinine 2.6, glucose 58.  UA positive for signs of infection.  Patient was given 1 L normal saline IV fluids and Unasyn.  Despite being given 2 A of D50 the patient's blood sugars are still noted to drop as low as 64.  Review of Systems  Unable to perform ROS: Dementia  Constitutional: Positive for malaise/fatigue.  HENT: Negative for ear discharge and nosebleeds.   Eyes: Negative for pain and discharge.  Respiratory: Negative for cough and  shortness of breath.   Cardiovascular: Negative for chest pain, palpitations and leg swelling.  Gastrointestinal: Negative for abdominal pain, blood in stool, diarrhea, nausea and vomiting.  Genitourinary: Negative for dysuria and frequency.  Musculoskeletal: Positive for myalgias. Negative for falls.  Skin: Positive for rash.  Neurological: Positive for weakness. Negative for speech change and focal weakness.  Psychiatric/Behavioral: Positive for memory loss. Negative for hallucinations and substance abuse.    Past Medical History:  Diagnosis Date  . Hypertension     Past Surgical History:  Procedure Laterality Date  . APPENDECTOMY    . KNEE SURGERY    . TONSILLECTOMY       reports that she quit smoking about 58 years ago. she has never used smokeless tobacco. She reports that she does not drink alcohol or use drugs.  Allergies  Allergen Reactions  . Amoxicillin-Pot Clavulanate Diarrhea  . Cephalexin Other (See Comments)    unknown  . Ciprofloxacin Other (See Comments)  . Levofloxacin Other (See Comments)  . Sulfamethoxazole-Trimethoprim Rash    Family History  Problem Relation Age of Onset  . Lung cancer Father     Prior to Admission medications   Medication Sig Start Date End Date Taking? Authorizing Provider  acetaminophen (TYLENOL) 325 MG tablet Take 2 tablets (650 mg total) by mouth every 6 (six) hours as needed for mild pain (or Fever >/= 101). 12/31/13  Yes Alison Murrayevine, Alma M, MD  acetaminophen (TYLENOL) 650 MG CR tablet Take 1,300 mg every morning by mouth.  Yes [provider]  aspirin EC 81 MG tablet Take 81 mg by mouth every other day.   Yes [provider]  cholecalciferol (VITAMIN D) 1000 UNITS tablet Take 2,000 Units 2 (two) times daily by mouth.    Yes [provider]  CRANBERRY PO Take 1 tablet by mouth daily with lunch.   Yes [provider]  hydrochlorothiazide (HYDRODIURIL) 25 MG tablet Take 25 mg daily by mouth.   Yes  [provider]  HYDROcodone-acetaminophen (NORCO/VICODIN) 5-325 MG per tablet Take 1-2 tablets by mouth every 12 (twelve) hours as needed for moderate pain. 12/31/13  Yes Alison Murrayevine, Alma M, MD  loratadine (CLARITIN) 10 MG tablet Take 10 mg by mouth daily as needed for allergies. For allergies   Yes [provider]  losartan (COZAAR) 100 MG tablet Take 100 mg daily by mouth.   Yes [provider]  Omega-3 Fatty Acids (FISH OIL) 1200 MG CAPS Take 1,200 mg every morning by mouth.    Yes [provider]  omeprazole (PRILOSEC) 20 MG capsule Take 20 mg daily by mouth.   Yes [provider]  ondansetron (ZOFRAN) 4 MG tablet Take 1 tablet (4 mg total) by mouth every 6 (six) hours as needed for nausea. 12/31/13  Yes Alison Murrayevine, Alma M, MD  Propylene Glycol (SYSTANE BALANCE OP) Apply 1 drop 4 (four) times daily as needed to eye (dry eyes).    Yes [provider]  ranitidine (ZANTAC) 150 MG capsule Take 150 mg by mouth 2 (two) times daily.   Yes [provider]  sertraline (ZOLOFT) 25 MG tablet Take 25 mg daily by mouth.   Yes [provider]  tamsulosin (FLOMAX) 0.4 MG CAPS capsule Take 0.4 mg by mouth.   Yes [provider]    Physical Exam:  Constitutional: Female who appears to be in no acute distress at this time and able to follow commands Vitals:   11/12/16 0415 11/12/16 0430 11/12/16 0500 11/12/16 0515  BP: (!) 137/30 (!) 130/34 (!) 113/32 (!) 124/28  Pulse: 74 75 70 73  Resp: (!) 21 18 14 17   Temp:      TempSrc:      SpO2: 96% 97% 96% 97%  Weight:      Height:       Eyes: PERRL, lids and conjunctivae normal ENMT: Mucous membranes are dry. Posterior pharynx clear of any exudate or lesions.  Neck: normal, supple, no masses, no thyromegaly Respiratory: clear to auscultation bilaterally, no wheezing, no crackles. Normal respiratory effort. No accessory muscle use.  Cardiovascular: Irregular rhythm, no murmurs / rubs /  gallops. No extremity edema. 2+ pedal pulses. No carotid bruits.  Abdomen: no tenderness, no masses palpated. No hepatosplenomegaly. Bowel sounds positive.   Musculoskeletal: no clubbing / cyanosis. No joint deformity upper and lower extremities. Good ROM, no contractures. Normal muscle tone.  Skin: Fungal appearing rash of the right lower extremity also on pads of feet.  Wound the right lower extremity draining serosanguineous fluid.  Pale appearance. Neurologic: CN 2-12 grossly intact.  Able to move all extremities Psychiatric: Normal judgment and insight. Alert and oriented x 3. Normal mood.     Labs on Admission: I have personally reviewed following labs and imaging studies  CBC: Recent Labs  Lab 11/12/16 0150  WBC 23.3*  HGB 9.9*  HCT 30.7*  MCV 75.6*  PLT 511*   Basic Metabolic Panel: Recent Labs  Lab 11/12/16 0150  NA 136  K 3.7  CL  107  CO2 18*  GLUCOSE 58*  BUN 97*  CREATININE 2.60*  CALCIUM 8.5*   GFR: Estimated Creatinine Clearance: 11.7 mL/min (A) (by C-G formula based on SCr of 2.6 mg/dL (H)). Liver Function Tests: Recent Labs  Lab 11/12/16 0150  AST 29  ALT 24  ALKPHOS 133*  BILITOT 0.8  PROT 6.5  ALBUMIN 2.5*   No results for input(s): LIPASE, AMYLASE in the last 168 hours. No results for input(s): AMMONIA in the last 168 hours. Coagulation Profile: No results for input(s): INR, PROTIME in the last 168 hours. Cardiac Enzymes: No results for input(s): CKTOTAL, CKMB, CKMBINDEX, TROPONINI in the last 168 hours. BNP (last 3 results) No results for input(s): PROBNP in the last 8760 hours. HbA1C: No results for input(s): HGBA1C in the last 72 hours. CBG: Recent Labs  Lab 11/12/16 0134 11/12/16 0304 11/12/16 0506  GLUCAP 55* 87 64*   Lipid Profile: No results for input(s): CHOL, HDL, LDLCALC, TRIG, CHOLHDL, LDLDIRECT in the last 72 hours. Thyroid Function Tests: No results for input(s): TSH, T4TOTAL, FREET4, T3FREE, THYROIDAB in the last 72  hours. Anemia Panel: No results for input(s): VITAMINB12, FOLATE, FERRITIN, TIBC, IRON, RETICCTPCT in the last 72 hours. Urine analysis:    Component Value Date/Time   COLORURINE YELLOW 11/12/2016 0248   APPEARANCEUR HAZY (A) 11/12/2016 0248   LABSPEC 1.016 11/12/2016 0248   PHURINE 5.0 11/12/2016 0248   GLUCOSEU NEGATIVE 11/12/2016 0248   HGBUR NEGATIVE 11/12/2016 0248   BILIRUBINUR NEGATIVE 11/12/2016 0248   KETONESUR NEGATIVE 11/12/2016 0248   PROTEINUR NEGATIVE 11/12/2016 0248   UROBILINOGEN 0.2 12/30/2013 1921   NITRITE NEGATIVE 11/12/2016 0248   LEUKOCYTESUR MODERATE (A) 11/12/2016 0248   Sepsis Labs: No results found for this or any previous visit (from the past 240 hour(s)).   Radiological Exams on Admission: Dg Chest 2 View  Result Date: 11/12/2016 CLINICAL DATA:  83 year old female with altered mental status and weakness. EXAM: CHEST  2 VIEW COMPARISON:  Chest radiograph dated 12/31/2013 FINDINGS: There is no focal consolidation, pleural effusion, or pneumothorax. Stable cardiomegaly. There is atherosclerotic calcification of the aortic arch. Osteopenia with chronic elevation of the shoulders indicative of chronic rotator cuff injury. Apparent focal discontinuity of the right posterior fifth rib may be artifactual. Correlation with clinical exam and point tenderness recommended to exclude an acute fracture. IMPRESSION: 1. No acute cardiopulmonary process. 2. Osteopenia. Apparent discontinuity of the right posterior fifth rib. Clinical correlation is recommended to exclude an acute fracture. Electronically Signed   By: Elgie Collard M.D.   On: 11/12/2016 02:24    EKG: Independently reviewed.  This rhythm with ventricular trigeminy  Assessment/Plan Urinary tract infection: Acute.  Patient noted to have urinary tract infection on admission.  Patient given Unasyn due to history of allergies. - Admit to stepdown bed due to(MAP in 50's) - Follow-up urine and blood culture -  Continue empiric antibiotics of Unasyn given history of allergies  Leukocytosis: WBC elevated at 23.3 on admission.  Suspect possibly secondary to the above. - Need to monitor  Acute renal failure: Baseline creatinine previously noted to be around 0.43 in 12/2013, patient presents with a creatinine of 2.6 and BUN 96.  Suspect prerenal cause of symptoms and patient also noted to be on hydrochlorothiazide - Strict intake and output - IV fluids of D5 1/2 normal saline at 100 mL/h as tolerated - Hold nephrotoxic agents   Hypoglycemia: Acute.  Patient blood sugars noted to be as low as 58 on admission  despite patient not being on any oral hypoglycemic agents or insulin at home.  Patient was given 2 Amp of D50 with persistent hypoglycemia - Hypoglycemic protocols - CBGs every 4 hours - Discontinue on blood sugars remained stable  Microcytic hypochromic anemia  : Hemoglobin noted to be 9.9 on admission.  Due to the significantly elevated BUN and low hemoglobin despite appearing dehydrated question possibility of GI bleed. - Check stool guaiac - Add on iron and TIBC  Right lower extremity wound and rash: Question of possibility of a fungal rash. - Wound care consult  DVT prophylaxis: scd   Code Status: Full Family Communication: Plan of care with the patient family present at bedside Disposition Plan: TBD  Consults called: none Admission status:Inpatient   Clydie Braun MD [facility] Pager [phone]   If 7PM-7AM, please contact night-coverage [email or web address] Password [facility]  11/12/2016, 5:43 AM

## 2016-11-12 NOTE — Progress Notes (Signed)
The patient was admitted early this AM after midnight and H and P has been reviewed and I am in current agreement with the Assessment and Plan done by Dr. Katrinka BlazingSmith. Additional changes to the plan of care have been made accordingly. The patient is a a 83 year old female with a PMH of HTN, and other comorbids who [facility] after she was unable to get out of her motorized wheel chair. She normally is able to assist in transfers and ambulates short distances but per report that she has become more lethargic and weak. She was admitted for a UTI infection and placed on IV Abx with Unasyn and also had an Acute Kidney injury. On admission she was also found to be hypoglycemic and had some microcytic anemia that is currently being worked up. She was found to have a Right Leg Ulcer that WOC evaluated. She was given an additional 1 Liter of NS and had foley placed. Will continue to monitor patient's clinical response to intervention and repeat Blood Work in AM. Will also order PT/OT to evaluate and treat.

## 2016-11-12 NOTE — ED Notes (Signed)
Pt sipping on apple juice 

## 2016-11-12 NOTE — Progress Notes (Signed)
Pharmacy Antibiotic Note  Kimberly Quinn is a 83 y.o. female admitted on 11/12/2016 with UTI.  Pharmacy has been consulted for Unasyn dosing. Noted allergy to Augmentin (Diarrhea)--likely just and intolerance to oral therapy. WBC elevated. Noted renal dysfunction.   Plan: -Unasyn 1.5g IV q12h -Trend WBC, temp, renal function  -F/U urine culture   Height: 5\' 3"  (160 cm) Weight: 130 lb (59 kg) IBW/kg (Calculated) : 52.4  Temp (24hrs), Avg:97.7 F (36.5 C), Min:97.7 F (36.5 C), Max:97.7 F (36.5 C)  Recent Labs  Lab 11/12/16 0150 11/12/16 0202  WBC 23.3*  --   CREATININE 2.60*  --   LATICACIDVEN  --  0.72    Estimated Creatinine Clearance: 11.7 mL/min (A) (by C-G formula based on SCr of 2.6 mg/dL (H)).    Allergies  Allergen Reactions  . Amoxicillin-Pot Clavulanate Diarrhea  . Cephalexin Other (See Comments)    unknown  . Ciprofloxacin Other (See Comments)  . Levofloxacin Other (See Comments)  . Sulfamethoxazole-Trimethoprim Rash     Abran DukeLedford, Kelin Nixon 11/12/2016 6:43 AM

## 2016-11-12 NOTE — ED Notes (Signed)
Pt complains of feeling tired. Pt has no other complaints.

## 2016-11-12 NOTE — Consult Note (Addendum)
WOC Nurse wound consult note Reason for Consult: Consult requested for right leg.  Pt is unable to recall etiology of how the wound occurred. Wound type: Right lower calf with 2 areas of full thickness wounds;  10X7X.2cm and 10X7X.2cm, each has 10% red/90% yellow slough to wound bed, mod amt tan drainage, no odor. They are separated by a narrow strip of skin to the posterior leg, which is dry and scabbed, dark brown.  There is cellulitis surrounding the wounds, which is bright red to her right foot, and fades to a lighter red patchy rash above the wounds on her upper calf.  Dressing procedure/placement/frequency: No family present to discuss plan of care and patient is confused.  Santyl ointment to provide enzymatic debridement of nonviable tissue.  Pt could benefit from home health assistance after discharge for dressing changes and continued assessment; please order if desired. Please re-consult if further assistance is needed.  Thank-you,  Cammie Mcgeeawn Brigitt Mcclish MSN, RN, CWOCN, [place], CNS [phone]

## 2016-11-12 NOTE — Plan of Care (Signed)
Continue current care plan 

## 2016-11-12 NOTE — ED Triage Notes (Signed)
[facility] reports the patient is more altered then normal. EMS also reports the pt is sleeping more then she usually does. EMS reports the pt was having bouts of bigemny and trigeminy PVC's. EMS reports the pt has no complaints.

## 2016-11-12 NOTE — ED Notes (Signed)
Attempted to call report and nurse was unavailable.

## 2016-11-13 DIAGNOSIS — E872 Acidosis, unspecified: Secondary | ICD-10-CM

## 2016-11-13 DIAGNOSIS — E876 Hypokalemia: Secondary | ICD-10-CM

## 2016-11-13 DIAGNOSIS — L03115 Cellulitis of right lower limb: Secondary | ICD-10-CM

## 2016-11-13 DIAGNOSIS — R197 Diarrhea, unspecified: Secondary | ICD-10-CM

## 2016-11-13 LAB — COMPREHENSIVE METABOLIC PANEL
ALBUMIN: 1.9 g/dL — AB (ref 3.5–5.0)
ALT: 23 U/L (ref 14–54)
ANION GAP: 7 (ref 5–15)
AST: 27 U/L (ref 15–41)
Alkaline Phosphatase: 124 U/L (ref 38–126)
BILIRUBIN TOTAL: 0.7 mg/dL (ref 0.3–1.2)
BUN: 70 mg/dL — ABNORMAL HIGH (ref 6–20)
CALCIUM: 7.7 mg/dL — AB (ref 8.9–10.3)
CO2: 17 mmol/L — ABNORMAL LOW (ref 22–32)
Chloride: 111 mmol/L (ref 101–111)
Creatinine, Ser: 1.56 mg/dL — ABNORMAL HIGH (ref 0.44–1.00)
GFR calc non Af Amer: 28 mL/min — ABNORMAL LOW (ref >60)
GFR, EST AFRICAN AMERICAN: 32 mL/min — AB (ref >60)
Glucose, Bld: 130 mg/dL — ABNORMAL HIGH (ref 65–99)
Potassium: 2.5 mmol/L — CL (ref 3.5–5.1)
Sodium: 135 mmol/L (ref 135–145)
TOTAL PROTEIN: 5.2 g/dL — AB (ref 6.5–8.1)

## 2016-11-13 LAB — CBC WITH DIFFERENTIAL/PLATELET
Basophils Absolute: 0 10*3/uL (ref 0.0–0.1)
Basophils Relative: 0 %
EOS ABS: 0.3 10*3/uL (ref 0.0–0.7)
EOS PCT: 1 %
HEMATOCRIT: 27.1 % — AB (ref 36.0–46.0)
Hemoglobin: 9 g/dL — ABNORMAL LOW (ref 12.0–15.0)
Lymphocytes Relative: 6 %
Lymphs Abs: 1.2 10*3/uL (ref 0.7–4.0)
MCH: 25.1 pg — ABNORMAL LOW (ref 26.0–34.0)
MCHC: 33.2 g/dL (ref 30.0–36.0)
MCV: 75.7 fL — ABNORMAL LOW (ref 78.0–100.0)
MONO ABS: 1.3 10*3/uL — AB (ref 0.1–1.0)
MONOS PCT: 6 %
NEUTROS ABS: 17.9 10*3/uL — AB (ref 1.7–7.7)
Neutrophils Relative %: 87 %
Platelets: 449 10*3/uL — ABNORMAL HIGH (ref 150–400)
RBC: 3.58 MIL/uL — ABNORMAL LOW (ref 3.87–5.11)
RDW: 18 % — AB (ref 11.5–15.5)
WBC: 20.6 10*3/uL — ABNORMAL HIGH (ref 4.0–10.5)

## 2016-11-13 LAB — GLUCOSE, CAPILLARY
GLUCOSE-CAPILLARY: 118 mg/dL — AB (ref 65–99)
GLUCOSE-CAPILLARY: 119 mg/dL — AB (ref 65–99)
GLUCOSE-CAPILLARY: 72 mg/dL (ref 65–99)
GLUCOSE-CAPILLARY: 89 mg/dL (ref 65–99)
Glucose-Capillary: 125 mg/dL — ABNORMAL HIGH (ref 65–99)
Glucose-Capillary: 74 mg/dL (ref 65–99)

## 2016-11-13 LAB — PHOSPHORUS: PHOSPHORUS: 2.3 mg/dL — AB (ref 2.5–4.6)

## 2016-11-13 LAB — MAGNESIUM: Magnesium: 1.8 mg/dL (ref 1.7–2.4)

## 2016-11-13 MED ORDER — POTASSIUM CHLORIDE 10 MEQ/100ML IV SOLN
10.0000 meq | INTRAVENOUS | Status: AC
  Administered 2016-11-13 (×4): 10 meq via INTRAVENOUS
  Filled 2016-11-13 (×4): qty 100

## 2016-11-13 MED ORDER — POTASSIUM CHLORIDE IN NACL 40-0.9 MEQ/L-% IV SOLN
INTRAVENOUS | Status: DC
  Administered 2016-11-13: 75 mL/h via INTRAVENOUS
  Filled 2016-11-13 (×2): qty 1000

## 2016-11-13 MED ORDER — POTASSIUM CHLORIDE CRYS ER 20 MEQ PO TBCR
40.0000 meq | EXTENDED_RELEASE_TABLET | Freq: Two times a day (BID) | ORAL | Status: DC
  Administered 2016-11-13 (×2): 40 meq via ORAL
  Filled 2016-11-13 (×2): qty 2

## 2016-11-13 MED ORDER — POTASSIUM CHLORIDE CRYS ER 20 MEQ PO TBCR
40.0000 meq | EXTENDED_RELEASE_TABLET | Freq: Once | ORAL | Status: AC
  Administered 2016-11-13: 40 meq via ORAL
  Filled 2016-11-13: qty 2

## 2016-11-13 MED ORDER — WHITE PETROLATUM EX OINT
TOPICAL_OINTMENT | CUTANEOUS | Status: AC
  Administered 2016-11-13
  Filled 2016-11-13: qty 28.35

## 2016-11-13 MED ORDER — ENOXAPARIN SODIUM 30 MG/0.3ML ~~LOC~~ SOLN
30.0000 mg | SUBCUTANEOUS | Status: DC
  Administered 2016-11-13: 30 mg via SUBCUTANEOUS
  Filled 2016-11-13: qty 0.3

## 2016-11-13 NOTE — Plan of Care (Signed)
Continue current care plan 

## 2016-11-13 NOTE — Evaluation (Signed)
Physical Therapy Evaluation Patient Details Name: Kimberly Quinn MRN: [id number] DOB: 1923/07/17 Today's Date: 11/13/2016   History of Present Illness  Pt is a 83 y.o. female admitted on 11/12/16 with increased lethargy and generalized weakness, unable to get out of her motorized w/c; pt worked up for UTI, AKI and hypoglycemia. Pt also found to have RLE ulcer [facility]. Only PMH on file includes HTN, spinal stenosis, knee surgery (unspecified).     Clinical Impression  Pt presents with an overall decrease in functional mobility secondary to above. PTA, pt reliant able to stand and transfer to/from w/c with assist from husband. Pt with increased confusion throughout session; unsure if this is baseline. Required maxA+2 for stand pivot transfer from bed to chair, requiring max encouragement for OOB mobility secondary to increased pain and fatigue. Discussed need for continued rehab with SNF-level therapies; [facility]. Pt would benefit from continued acute PT services to maximize functional mobility and independence.     Follow Up Recommendations SNF;Supervision/Assistance - 24 hour(husband requesting [facility])    Equipment Recommendations  Other (comment)(TBD next venue)    Recommendations for Other Services       Precautions / Restrictions Precautions Precautions: Fall Restrictions Weight Bearing Restrictions: No      Mobility  Bed Mobility Overal bed mobility: Needs Assistance Bed Mobility: Supine to Sit     Supine to sit: Mod assist;+2 for physical assistance     General bed mobility comments: ModA+2 to assist trunk into elevation and scoot bilateral hips to EOB. Increased time and effort secondary to c/o pain with all mobility and increased confusion  Transfers Overall transfer level: Needs assistance Equipment used: 2 person hand held assist Transfers: Sit to/from [employer]Stand;Stand Pivot Transfers Sit  to Stand: Max assist;+2 physical assistance Stand pivot transfers: Max assist;+2 physical assistance       General transfer comment: MaxA+2 to stand pivot from bed to chair; maxA+1 to squat for pericare (dependent for pericare) secondary to bowel incontinence. Increased time secondary to c/o pain and pt's confusion  Ambulation/Gait                Stairs            Wheelchair Mobility    Modified Rankin (Stroke Patients Only)       Balance Overall balance assessment: Needs assistance   Sitting balance-Leahy Scale: Fair       Standing balance-Leahy Scale: Poor Standing balance comment: Reliant on BUE support and external assist                             Pertinent Vitals/Pain Pain Assessment: Faces Faces Pain Scale: Hurts little more Pain Location: Generalized (particularly RLE) Pain Descriptors / Indicators: Constant;Discomfort Pain Intervention(s): Limited activity within patient's tolerance;Monitored during session;Repositioned    Home Living Family/patient expects to be discharged to:: Skilled nursing facility Living Arrangements: Spouse/significant other                    Prior Function Level of Independence: Needs assistance   Gait / Transfers Assistance Needed: Per husband, he pushes pt in manual w/c at home; up until this past Sunday, pt was able to stand and assist with transfers. Since then, pt has become progressively weaker           Hand Dominance        Extremity/Trunk Assessment  Upper Extremity Assessment Upper Extremity Assessment: Generalized weakness    Lower Extremity Assessment Lower Extremity Assessment: Generalized weakness    Cervical / Trunk Assessment Cervical / Trunk Assessment: Kyphotic  Communication      Cognition Arousal/Alertness: Awake/alert Behavior During Therapy: Anxious Overall Cognitive Status: History of cognitive impairments - at baseline Area of Impairment:  Orientation;Attention;Memory;Following commands;Safety/judgement;Awareness;Problem solving                 Orientation Level: Disoriented to;Place;Situation Current Attention Level: Sustained;Focused Memory: Decreased short-term memory Following Commands: Follows one step commands inconsistently Safety/Judgement: Decreased awareness of safety;Decreased awareness of deficits Awareness: Intellectual Problem Solving: Difficulty sequencing;Requires verbal cues;Requires tactile cues General Comments: Unsure if pt is at baseline; husband did not provide feedback regarding cognition, but he seems to be questionable historian as well. Pt with increased confusion throughout session, repeatedly asking if she was in hospital and what PT/OT was doing to her despite multiple reorientation attempts      General Comments      Exercises     Assessment/Plan    PT Assessment Patient needs continued PT services  PT Problem List Decreased strength;Decreased activity tolerance;Decreased balance;Decreased mobility;Decreased cognition;Decreased knowledge of use of DME;Decreased safety awareness;Pain       PT Treatment Interventions DME instruction;Gait training;Stair training;Functional mobility training;Therapeutic activities;Therapeutic exercise;Balance training;Patient/family education;Wheelchair mobility training    PT Goals (Current goals can be found in the Care Plan section)  Acute Rehab PT Goals Patient Stated Goal: Rehab at SNF PT Goal Formulation: With family Time For Goal Achievement: 11/27/16 Potential to Achieve Goals: Good    Frequency Min 2X/week   Barriers to discharge        Co-evaluation PT/OT/SLP Co-Evaluation/Treatment: Yes Reason for Co-Treatment: Necessary to address cognition/behavior during functional activity;For patient/therapist safety;To address functional/ADL transfers PT goals addressed during session: Mobility/safety with mobility         AM-PAC PT "6  Clicks" Daily Activity  Outcome Measure Difficulty turning over in bed (including adjusting bedclothes, sheets and blankets)?: Unable Difficulty moving from lying on back to sitting on the side of the bed? : Unable Difficulty sitting down on and standing up from a chair with arms (e.g., wheelchair, bedside commode, etc,.)?: Unable Help needed moving to and from a bed to chair (including a wheelchair)?: A Lot Help needed walking in hospital room?: A Lot Help needed climbing 3-5 steps with a railing? : Total 6 Click Score: 8    End of Session Equipment Utilized During Treatment: Gait belt Activity Tolerance: Patient limited by pain Patient left: in chair;with call bell/phone within reach;with family/visitor present Nurse Communication: Mobility status PT Visit Diagnosis: Other abnormalities of gait and mobility (R26.89);Muscle weakness (generalized) (M62.81)    Time: [id number] PT Time Calculation (min) (ACUTE ONLY): 34 min   Charges:   PT Evaluation $PT Eval Moderate Complexity: 1 Mod     PT G Codes:       Ina HomesJaclyn Regenia Erck, PT, DPT Acute Rehab Services  Pager: [phone]  Malachy ChamberJaclyn L Desman Polak 11/13/2016, 3:25 PM

## 2016-11-13 NOTE — Progress Notes (Signed)
Pt had a critical Potassium level of 2.5. MD notified

## 2016-11-13 NOTE — Care Management Note (Signed)
Case Management Note  Patient Details  Name: Kimberly Quinn MRN: [id number] Date of Birth: 21-May-1923  Subjective/Objective:    Pt admitted with UTI                Action/Plan:  [facility] home with [facility]  Agency recommending SNF at discharge based on home visit assessments.  CM informed PT - eval pending    Expected Discharge Date:                  Expected Discharge Plan:  Skilled Nursing Facility  In-House Referral:  Clinical Social Work  Discharge planning Services  CM Consult  Post Acute Care Choice:    Choice offered to:     DME Arranged:    DME Agency:     HH Arranged:    HH Agency:     Status of Service:     If discussed at [employer] Length of [employer] Meetings, dates discussed:    Additional Comments:  Cherylann ParrClaxton, Kerri-Anne Haeberle S, RN 11/13/2016, 10:59 AM

## 2016-11-13 NOTE — Progress Notes (Signed)
PROGRESS NOTE    Kimberly Quinn  JYN:[id number] DOB: May 23, 1923 DOA: 11/12/2016 PCP: Lupita RaiderShaw, [name], MD   Brief Narrative:  The patient is a a 83 year old female with a PMH of HTN, and other comorbids who presented to [facility] after she was unable to get out of her motorized wheel chair. She normally is able to assist in transfers and ambulates short distances but per report that she has become more lethargic and weak. She was admitted for a UTI infection and placed on IV Abx with Unasyn and also had an Acute Kidney injury. On admission she was also found to be hypoglycemic and had some microcytic anemia that is currently being worked up. She was found to have a Right Leg Ulcer and Cellulitis that WOC evaluated. She is improving but developed some loose stools. Remains slightly confused.   Assessment & Plan:   Principal Problem:   UTI (urinary tract infection) Active Problems:   Leukocytosis   ARF (acute renal failure) (HCC)   Hypoglycemia   Open wound of right lower extremity   Rash and nonspecific skin eruption   Hypochromic microcytic anemia   Metabolic acidosis   Hypophosphatemia   Hypokalemia   Cellulitis of right leg   Diarrhea  Klebsiella Pneumonia UTI -Patient noted to have UTI on admission. Urinalysis showed Hazy Urine, Moderate Leukocytes, Many Bacteria and TNTC WBC -Urine Cx showed >100,000 Klebsiella Pneumoniae with Sensitivities Pending -Patient given Unasyn due to history of allergies. Will continue for Now until Sensitivities result -Admitted to stepdown bed due to (MAP in 50's) -Follow-up Urine and Blood Culture; Blood Cx x2 show NGTD at 1 day -Continue Empiric antibiotics of Unasyn given history of allergies -C/w NS + 40 mEQ at 75 mL/hr -C/w Zofran 4 mg po/IV q6hprn  Leukocytosis  -WBC elevated at 23.3 on admission. Now improved to 20.6 -Suspect possibly secondary to the above. -Repeat CBC in AM  Diarrhea/Loose Stools -Likely Abx induced  -Nurse  stated she has had 2 loose Bowel movements -If continues to worsen Check C Difficile and GI Pathogen Panel -Continue to Monitor -C/w Lactobacillus Acidophilus 2 tab po TID  Acute Kidney Injury -Baseline creatinine previously noted to be around 0.43 in 12/2013, patient presents with a creatinine of 2.6 and BUN 96. Suspect prerenal cause of symptoms and patient also noted to be on hydrochlorothiazide -Strict intake and output; Foley Catheter inserted -IVF changed to NS + 40 mEQ at 75 mL/hr -BUN/Cr went from 97/2.60 -> 70/1.56 -Hold Nephrotoxic agents   Hypoglycemia -Patient blood sugars noted to be as low as 58 on admission despite patient not being on any oral hypoglycemic agents or insulin at home. -Patient was given 2 Amp of D50 with persistent hypoglycemia -Hypoglycemic protocols; -D5W Stopped; continue to Monitor  -CBGs every 4 hours; CBG's ranging from 72-135  Microcytic Hypochromic Anemia -Hemoglobin noted to be 9.9 on admission.   -Due to the significantly elevated BUN and low hemoglobin despite appearing dehydrated question possibility of GI bleed. -Check Stool Guaiac (pending) -Iron Level was 14, UIBC was 169, TIBC was 183, and Saturations was 8 -Hb/Hct went from 9.9/30.7 -> 9.0/27.1  Right Lower Extremity Wound/Rash/Ulcer Laterally and Medially -Has surrounding Cellulitis -WOC Nurse consulted and recommending Santyl ointment to provide enzymatic debridement of nonviable tissue  -C/w Abx with IV Unasyn 1.5 g q12h  Right Leg Cellulitis -As Above -C/w Unasyn as above   Hypokalemia likely from Diarrhea -Patient's K+ Level this AM was 2.5 -Replete with po KCl 40  mEQ BID x2 doses, IV KCl 40 mEQ, and with NS + 40 mEQ of KCl at 75 mL/hr -Continue to Monitor and Replete as Necessary -Repeat CMP in AM   Metabolic Acidosis -CO2 was 17 -May need to have Bicarb gtt started.   Hypophosphatemia -Patient's Phos Level was 2.3 -Continue to Monitor and Repeat Phos Level in  AM  DVT prophylaxis: SCDs and Lovenox 40 mg sq q24h Code Status: FULL CODE Family Communication: No family present at bedside Disposition Plan: SNF at Discharge if Patient is agreeable  Consultants:   WOC Nurse   Procedures:   None   Antimicrobials:  Anti-infectives (From admission, onward)   Start     Dose/Rate Route Frequency Ordered Stop   11/12/16 2200  ampicillin-sulbactam (UNASYN) 1.5 g in sodium chloride 0.9 % 50 mL IVPB     1.5 g 100 mL/hr over 30 Minutes Intravenous Every 12 hours 11/12/16 0642     11/12/16 0515  ampicillin-sulbactam (UNASYN) 1.5 g in sodium chloride 0.9 % 50 mL IVPB     1.5 g 100 mL/hr over 30 Minutes Intravenous  Once 11/12/16 0512 11/12/16 1610     Subjective: Seen and examined and had some diarrhea. No CP or SOB. Wanting to get up and urinate. No other concerns or complaints or concerns. At this time.   Objective: Vitals:   11/13/16 1300 11/13/16 1400 11/13/16 1650 11/13/16 1700  BP: (!) 136/46 (!) 158/83 (!) 157/61 (!) 173/58  Pulse: 80 88 78 78  Resp: 17 20 14 19   Temp:   98.4 F (36.9 C)   TempSrc:   Oral   SpO2: 97% 96% 97% 99%  Weight:      Height:        Intake/Output Summary (Last 24 hours) at 11/13/2016 2046 Last data filed at 11/13/2016 1700 Gross per 24 hour  Intake 1950 ml  Output 500 ml  Net 1450 ml   Filed Weights   11/12/16 0211 11/12/16 0700  Weight: 59 kg (130 lb) 65.1 kg (143 lb 9.6 oz)   Examination: Physical Exam:  Constitutional: Caucasian female in NAD but appears  uncomfortable Eyes: Lids and conjunctivae normal, sclerae anicteric  ENMT: External Ears, Nose appear normal. Grossly normal hearing. Mucous membranes are dry.  Neck: Appears normal, supple, no cervical masses, normal ROM, no appreciable thyromegaly, no JVD Respiratory: Diminished to auscultation bilaterally, no wheezing, rales, rhonchi or crackles. Normal respiratory effort and patient is not tachypenic. No accessory muscle use.   Cardiovascular: RRR, no murmurs / rubs / gallops. S1 and S2 auscultated. Mild extremity edema on Right.  Abdomen: Soft, non-tender, non-distended. No masses palpated. No appreciable hepatosplenomegaly. Bowel sounds positive.  GU: Deferred. Musculoskeletal: No clubbing / cyanosis of digits/nails. No joint deformity upper and lower extremities Skin: Has a right LE ulcer/cellulitis that is wrapped. No induration; Warm and dry.  Neurologic: CN 2-12 grossly intact with no focal deficits. Romberg sign and cerebellar reflexes not assessed.  Psychiatric: Impaired judgment and insight. Alert and awake but slightly confused. Normal mood and appropriate affect.    Data Reviewed: I have personally reviewed following labs and imaging studies  CBC: Recent Labs  Lab 11/12/16 0150 11/13/16 0224  WBC 23.3* 20.6*  NEUTROABS  --  17.9*  HGB 9.9* 9.0*  HCT 30.7* 27.1*  MCV 75.6* 75.7*  PLT 511* 449*   Basic Metabolic Panel: Recent Labs  Lab 11/12/16 0150 11/13/16 0224  NA 136 135  K 3.7 2.5*  CL 107 111  CO2  18* 17*  GLUCOSE 58* 130*  BUN 97* 70*  CREATININE 2.60* 1.56*  CALCIUM 8.5* 7.7*  MG  --  1.8  PHOS  --  2.3*   GFR: Estimated Creatinine Clearance: 21.3 mL/min (A) (by C-G formula based on SCr of 1.56 mg/dL (H)). Liver Function Tests: Recent Labs  Lab 11/12/16 0150 11/13/16 0224  AST 29 27  ALT 24 23  ALKPHOS 133* 124  BILITOT 0.8 0.7  PROT 6.5 5.2*  ALBUMIN 2.5* 1.9*   No results for input(s): LIPASE, AMYLASE in the last 168 hours. No results for input(s): AMMONIA in the last 168 hours. Coagulation Profile: No results for input(s): INR, PROTIME in the last 168 hours. Cardiac Enzymes: No results for input(s): CKTOTAL, CKMB, CKMBINDEX, TROPONINI in the last 168 hours. BNP (last 3 results) No results for input(s): PROBNP in the last 8760 hours. HbA1C: No results for input(s): HGBA1C in the last 72 hours. CBG: Recent Labs  Lab 11/13/16 0013 11/13/16 0357  11/13/16 0750 11/13/16 1159 11/13/16 1636  GLUCAP 118* 125* 119* 89 72   Lipid Profile: No results for input(s): CHOL, HDL, LDLCALC, TRIG, CHOLHDL, LDLDIRECT in the last 72 hours. Thyroid Function Tests: Recent Labs    11/12/16 0649  TSH 0.880   Anemia Panel: Recent Labs    11/12/16 0649  TIBC 183*  IRON 14*   Sepsis Labs: Recent Labs  Lab 11/12/16 0202  LATICACIDVEN 0.72    Recent Results (from the past 240 hour(s))  Urine culture     Status: Abnormal (Preliminary result)   Collection Time: 11/12/16  2:48 AM  Result Value Ref Range Status   Specimen Description URINE, CATHETERIZED  Final   Special Requests ADDED 0614  Final   Culture >=100,000 COLONIES/mL KLEBSIELLA PNEUMONIAE (A)  Final   Report Status PENDING  Incomplete  Blood culture (routine x 2)     Status: None (Preliminary result)   Collection Time: 11/12/16  5:20 AM  Result Value Ref Range Status   Specimen Description BLOOD RIGHT ANTECUBITAL  Final   Special Requests IN PEDIATRIC BOTTLE Blood Culture adequate volume  Final   Culture NO GROWTH 1 DAY  Final   Report Status PENDING  Incomplete  Blood culture (routine x 2)     Status: None (Preliminary result)   Collection Time: 11/12/16  5:25 AM  Result Value Ref Range Status   Specimen Description BLOOD RIGHT HAND  Final   Special Requests IN PEDIATRIC BOTTLE Blood Culture adequate volume  Final   Culture NO GROWTH 1 DAY  Final   Report Status PENDING  Incomplete  MRSA PCR Screening     Status: None   Collection Time: 11/12/16  6:48 AM  Result Value Ref Range Status   MRSA by PCR NEGATIVE NEGATIVE Final    Comment:        The GeneXpert MRSA Assay (FDA approved for NASAL specimens only), is one component of a comprehensive MRSA colonization surveillance program. It is not intended to diagnose MRSA infection nor to guide or monitor treatment for MRSA infections.     Radiology Studies: Dg Chest 2 View  Result Date: 11/12/2016 CLINICAL DATA:   83 year old female with altered mental status and weakness. EXAM: CHEST  2 VIEW COMPARISON:  Chest radiograph dated 12/31/2013 FINDINGS: There is no focal consolidation, pleural effusion, or pneumothorax. Stable cardiomegaly. There is atherosclerotic calcification of the aortic arch. Osteopenia with chronic elevation of the shoulders indicative of chronic rotator cuff injury. Apparent focal discontinuity of the  right posterior fifth rib may be artifactual. Correlation with clinical exam and point tenderness recommended to exclude an acute fracture. IMPRESSION: 1. No acute cardiopulmonary process. 2. Osteopenia. Apparent discontinuity of the right posterior fifth rib. Clinical correlation is recommended to exclude an acute fracture. Electronically Signed   By: Elgie CollardArash  Radparvar M.D.   On: 11/12/2016 02:24   Scheduled Meds: . collagenase   Topical Daily  . enoxaparin (LOVENOX) injection  30 mg Subcutaneous Q24H  . lactobacillus acidophilus  2 tablet Oral TID  . mouth rinse  15 mL Mouth Rinse BID  . potassium chloride  40 mEq Oral BID   Continuous Infusions: . 0.9 % NaCl with KCl 40 mEq / L 75 mL/hr (11/13/16 0836)  . ampicillin-sulbactam (UNASYN) IV Stopped (11/13/16 0940)    LOS: 1 day   Merlene Laughtermair Latif Conall Vangorder, DO [facility] Pager [phone]  If 7PM-7AM, please contact night-coverage [email or web address] Password [email or web address] 11/13/2016, 8:46 PM

## 2016-11-13 NOTE — Evaluation (Signed)
Occupational Therapy Evaluation Patient Details Name: Kimberly Quinn MRN: [id number] DOB: 03-17-1923 Today's Date: 11/13/2016    History of Present Illness Pt is a 83 y.o. female admitted on 11/12/16 with increased lethargy and generalized weakness, unable to get out of her w/c; pt worked up for UTI, AKI and hypoglycemia. Pt also found to have RLE ulcer being followed by [facility] nurse. Only PMH on file includes HTN, spinal stenosis, knee surgery (unspecified).    Clinical Impression   Pt presents to OT evaluation with poor cognition, generalized weakness, and decreased activity tolerance. Pt asking multiple times where she was despite education and re-orientation. She was able to complete stand-pivot simulated toilet transfer with max assist +2. She additionally requires total assist for LB ADL and toileting hygiene. Pt requiring maximum encouragement to participate in self-feeding tasks this session as well. Per pt and husband she had been able to assist with transfers and ADL participation prior to onset of weakness on Sunday. At current functional level, recommend SNF level rehabilitation post-acute D/C to maximize safety and return to PLOF. OT will continue to follow while admitted.     Follow Up Recommendations  SNF;Supervision/Assistance - 24 hour    Equipment Recommendations  Other (comment)(TBD at next venue of care)    Recommendations for Other Services       Precautions / Restrictions Precautions Precautions: Fall Restrictions Weight Bearing Restrictions: No      Mobility Bed Mobility Overal bed mobility: Needs Assistance Bed Mobility: Supine to Sit     Supine to sit: Mod assist;+2 for physical assistance     General bed mobility comments: ModA+2 to assist trunk into elevation and scoot bilateral hips to EOB. Increased time and effort secondary to c/o pain with all mobility and increased confusion  Transfers Overall transfer level: Needs assistance Equipment used: 2  person hand held assist Transfers: Sit to/from [employer] Sit to Stand: Max assist;+2 physical assistance Stand pivot transfers: Max assist;+2 physical assistance       General transfer comment: MaxA+2 to stand pivot from bed to chair; maxA+1 to squat for pericare (dependent for pericare) secondary to bowel incontinence. Increased time secondary to c/o pain and pt's confusion    Balance Overall balance assessment: Needs assistance Sitting-balance support: No upper extremity supported;Feet supported Sitting balance-Leahy Scale: Fair     Standing balance support: Bilateral upper extremity supported;During functional activity Standing balance-Leahy Scale: Poor Standing balance comment: Reliant on BUE support and external assist                           ADL either performed or assessed with clinical judgement   ADL Overall ADL's : Needs assistance/impaired Eating/Feeding: Sitting;Minimal assistance Eating/Feeding Details (indicate cue type and reason): Question effort or motivation to complete independently.  Grooming: Sitting;Minimal assistance   Upper Body Bathing: Sitting;Moderate assistance   Lower Body Bathing: Total assistance;Sit to/from stand   Upper Body Dressing : Moderate assistance;Sitting   Lower Body Dressing: Total assistance;Sit to/from stand   Toilet Transfer: Maximal assistance;Stand-pivot;+2 for physical assistance Toilet Transfer Details (indicate cue type and reason): Simulated from bed to chair.  Toileting- Clothing Manipulation and Hygiene: Total assistance;Sit to/from stand         General ADL Comments: Pt requiring encouragement to participate. Very confused and requiring multiple attempts at re-orientation.      Vision Patient Visual Report: (unsure of baseline) Vision Assessment?: No apparent visual deficits     Perception  Praxis      Pertinent Vitals/Pain Pain Assessment: Faces Faces Pain Scale: Hurts  little more Pain Location: Generalized (particularly RLE); B LE very sensitive to touch with pt reporting light touch as painful Pain Descriptors / Indicators: Constant;Discomfort Pain Intervention(s): Limited activity within patient's tolerance;Monitored during session;Repositioned     Hand Dominance     Extremity/Trunk Assessment Upper Extremity Assessment Upper Extremity Assessment: Generalized weakness   Lower Extremity Assessment Lower Extremity Assessment: Generalized weakness   Cervical / Trunk Assessment Cervical / Trunk Assessment: Kyphotic   Communication Communication Communication: No difficulties   Cognition Arousal/Alertness: Awake/alert Behavior During Therapy: Anxious Overall Cognitive Status: History of cognitive impairments - at baseline Area of Impairment: Orientation;Attention;Memory;Following commands;Safety/judgement;Awareness;Problem solving                 Orientation Level: Disoriented to;Place;Situation Current Attention Level: Sustained;Focused Memory: Decreased short-term memory Following Commands: Follows one step commands inconsistently Safety/Judgement: Decreased awareness of safety;Decreased awareness of deficits Awareness: Intellectual Problem Solving: Difficulty sequencing;Requires verbal cues;Requires tactile cues General Comments: Unsure if pt is at baseline; husband did not provide feedback regarding cognition, but he seems to be questionable historian as well. Pt with increased confusion throughout session, repeatedly asking if she was in hospital and what PT/OT was doing to her despite multiple reorientation attempts   General Comments  VSS throughout session    Exercises     Shoulder Instructions      Home Living Family/patient expects to be discharged to:: Skilled nursing facility Living Arrangements: Spouse/significant other                                      Prior Functioning/Environment Level of  Independence: Needs assistance  Gait / Transfers Assistance Needed: Per husband, he pushes pt in manual w/c at home; up until this past Sunday, pt was able to stand and assist with transfers. Since then, pt has become progressively weaker ADL's / Homemaking Assistance Needed: Pt reporting "I don't do much" when asked about ADL. Need to further assess. Pt's husband present but unsure of reliability of his report concerning pt's level of independence.             OT Problem List: Decreased strength;Decreased range of motion;Decreased activity tolerance;Impaired balance (sitting and/or standing);Decreased safety awareness;Decreased knowledge of use of DME or AE;Decreased knowledge of precautions;Pain      OT Treatment/Interventions: Self-care/ADL training;Therapeutic exercise;Energy conservation;DME and/or AE instruction;Therapeutic activities;Patient/family education;Balance training;Cognitive remediation/compensation    OT Goals(Current goals can be found in the care plan section) Acute Rehab OT Goals Patient Stated Goal: to get back in bed OT Goal Formulation: With patient/family Time For Goal Achievement: 11/27/16 Potential to Achieve Goals: Good ADL Goals Pt Will Perform Grooming: (P) with supervision;sitting Pt Will Transfer to Toilet: (P) with mod assist;stand pivot transfer;bedside commode Pt Will Perform Toileting - Clothing Manipulation and hygiene: (P) with mod assist;sit to/from stand Additional ADL Goal #1: (P) Pt will demonstrate selective attention during seated grooming tasks in minimally distracting environment. Additional ADL Goal #2: (P) Pt will demonstrate emergent awareness during toileting tasks.  OT Frequency: Min 2X/week   Barriers to D/C:            Co-evaluation PT/OT/SLP Co-Evaluation/Treatment: Yes Reason for Co-Treatment: Necessary to address cognition/behavior during functional activity;For patient/therapist safety;To address functional/ADL transfers PT  goals addressed during session: Mobility/safety with mobility OT goals addressed during session: ADL's and self-care  AM-PAC PT "6 Clicks" Daily Activity     Outcome Measure Help from another person eating meals?: A Little Help from another person taking care of personal grooming?: A Little Help from another person toileting, which includes using toliet, bedpan, or urinal?: A Lot Help from another person bathing (including washing, rinsing, drying)?: Total Help from another person to put on and taking off regular upper body clothing?: A Lot Help from another person to put on and taking off regular lower body clothing?: Total 6 Click Score: 12   End of Session Equipment Utilized During Treatment: Gait belt Nurse Communication: Mobility status  Activity Tolerance: Patient tolerated treatment well Patient left: in chair;with call bell/phone within reach;with family/visitor present  OT Visit Diagnosis: Other abnormalities of gait and mobility (R26.89);Muscle weakness (generalized) (M62.81);Other symptoms and signs involving cognitive function                Time: [id number] OT Time Calculation (min): 33 min Charges:  OT General Charges $OT Visit: 1 Visit OT Evaluation $OT Eval Moderate Complexity: 1 Mod G-Codes:     Doristine Sectionharity A Baylyn Sickles, MS OTR/L  Pager: [phone]  Koa Zoeller A Palmyra Rogacki 11/13/2016, 4:56 PM

## 2016-11-14 DIAGNOSIS — L8990 Pressure ulcer of unspecified site, unspecified stage: Secondary | ICD-10-CM

## 2016-11-14 LAB — COMPREHENSIVE METABOLIC PANEL
ALT: 26 U/L (ref 14–54)
ANION GAP: 7 (ref 5–15)
AST: 29 U/L (ref 15–41)
Albumin: 2.1 g/dL — ABNORMAL LOW (ref 3.5–5.0)
Alkaline Phosphatase: 147 U/L — ABNORMAL HIGH (ref 38–126)
BILIRUBIN TOTAL: 0.9 mg/dL (ref 0.3–1.2)
BUN: 39 mg/dL — AB (ref 6–20)
CO2: 16 mmol/L — ABNORMAL LOW (ref 22–32)
Calcium: 8.3 mg/dL — ABNORMAL LOW (ref 8.9–10.3)
Chloride: 118 mmol/L — ABNORMAL HIGH (ref 101–111)
Creatinine, Ser: 1.04 mg/dL — ABNORMAL HIGH (ref 0.44–1.00)
GFR calc Af Amer: 53 mL/min — ABNORMAL LOW (ref >60)
GFR, EST NON AFRICAN AMERICAN: 46 mL/min — AB (ref >60)
Glucose, Bld: 76 mg/dL (ref 65–99)
POTASSIUM: 5.2 mmol/L — AB (ref 3.5–5.1)
Sodium: 141 mmol/L (ref 135–145)
TOTAL PROTEIN: 5.8 g/dL — AB (ref 6.5–8.1)

## 2016-11-14 LAB — BASIC METABOLIC PANEL
Anion gap: 3 — ABNORMAL LOW (ref 5–15)
BUN: 33 mg/dL — ABNORMAL HIGH (ref 6–20)
CHLORIDE: 122 mmol/L — AB (ref 101–111)
CO2: 16 mmol/L — AB (ref 22–32)
CREATININE: 0.97 mg/dL (ref 0.44–1.00)
Calcium: 8.3 mg/dL — ABNORMAL LOW (ref 8.9–10.3)
GFR calc non Af Amer: 50 mL/min — ABNORMAL LOW (ref >60)
GFR, EST AFRICAN AMERICAN: 57 mL/min — AB (ref >60)
Glucose, Bld: 87 mg/dL (ref 65–99)
POTASSIUM: 5.3 mmol/L — AB (ref 3.5–5.1)
Sodium: 141 mmol/L (ref 135–145)

## 2016-11-14 LAB — PHOSPHORUS: Phosphorus: 1.3 mg/dL — ABNORMAL LOW (ref 2.5–4.6)

## 2016-11-14 LAB — CBC WITH DIFFERENTIAL/PLATELET
Basophils Absolute: 0 10*3/uL (ref 0.0–0.1)
Basophils Relative: 0 %
EOS PCT: 1 %
Eosinophils Absolute: 0.2 10*3/uL (ref 0.0–0.7)
HCT: 30.1 % — ABNORMAL LOW (ref 36.0–46.0)
Hemoglobin: 9.6 g/dL — ABNORMAL LOW (ref 12.0–15.0)
LYMPHS ABS: 1.1 10*3/uL (ref 0.7–4.0)
LYMPHS PCT: 7 %
MCH: 23.9 pg — AB (ref 26.0–34.0)
MCHC: 31.9 g/dL (ref 30.0–36.0)
MCV: 75.1 fL — AB (ref 78.0–100.0)
MONO ABS: 1.2 10*3/uL — AB (ref 0.1–1.0)
MONOS PCT: 7 %
Neutro Abs: 13.7 10*3/uL — ABNORMAL HIGH (ref 1.7–7.7)
Neutrophils Relative %: 85 %
Platelets: 530 10*3/uL — ABNORMAL HIGH (ref 150–400)
RBC: 4.01 MIL/uL (ref 3.87–5.11)
RDW: 18 % — AB (ref 11.5–15.5)
WBC: 16.2 10*3/uL — ABNORMAL HIGH (ref 4.0–10.5)

## 2016-11-14 LAB — GLUCOSE, CAPILLARY
GLUCOSE-CAPILLARY: 75 mg/dL (ref 65–99)
GLUCOSE-CAPILLARY: 72 mg/dL (ref 65–99)
Glucose-Capillary: 64 mg/dL — ABNORMAL LOW (ref 65–99)
Glucose-Capillary: 68 mg/dL (ref 65–99)
Glucose-Capillary: 70 mg/dL (ref 65–99)
Glucose-Capillary: 72 mg/dL (ref 65–99)
Glucose-Capillary: 75 mg/dL (ref 65–99)

## 2016-11-14 LAB — URINE CULTURE: Culture: >=100000 — AB

## 2016-11-14 LAB — MAGNESIUM: MAGNESIUM: 1.7 mg/dL (ref 1.7–2.4)

## 2016-11-14 MED ORDER — ENOXAPARIN SODIUM 40 MG/0.4ML ~~LOC~~ SOLN
40.0000 mg | SUBCUTANEOUS | Status: DC
  Administered 2016-11-14 – 2016-11-16 (×3): 40 mg via SUBCUTANEOUS
  Filled 2016-11-14 (×3): qty 0.4

## 2016-11-14 MED ORDER — SODIUM CHLORIDE 0.9 % IV SOLN
20.0000 mmol | Freq: Once | INTRAVENOUS | Status: AC
  Administered 2016-11-14: 20 mmol via INTRAVENOUS
  Filled 2016-11-14: qty 20

## 2016-11-14 MED ORDER — CEFAZOLIN SODIUM-DEXTROSE 1-4 GM/50ML-% IV SOLN
1.0000 g | Freq: Two times a day (BID) | INTRAVENOUS | Status: DC
  Administered 2016-11-14 – 2016-11-17 (×7): 1 g via INTRAVENOUS
  Filled 2016-11-14 (×7): qty 50

## 2016-11-14 MED ORDER — DEXTROSE 50 % IV SOLN
INTRAVENOUS | Status: AC
  Administered 2016-11-14: 50 mL
  Filled 2016-11-14: qty 50

## 2016-11-14 NOTE — Progress Notes (Signed)
Pharmacy Antibiotic Note  Kimberly Quinn is a 83 y.o. female admitted on 11/12/2016 with UTI.  Pharmacy has been consulted for Ancef dosing.  Urine cx grew klebsiella pneumonia which was I to Unasyn (patient previously on this). CrCl ~30-835mL/min.  Plan: -Ancef 1g IV q12h- noted allergy to cephalexin, however there is no reaction listed in our files or in CareEverywhere. Other abx "allergies" listed are GI problems. In addition, patient tolerated Unasyn which would be unusual if she had a serious reaction to cephalosporins. Will watch closely -Follow renal function, clinical progression, LOT   Height: 5\' 3"  (160 cm) Weight: 144 lb (65.3 kg) IBW/kg (Calculated) : 52.4  Temp (24hrs), Avg:98.1 F (36.7 C), Min:97.6 F (36.4 C), Max:98.4 F (36.9 C)  Recent Labs  Lab 11/12/16 0150 11/12/16 0202 11/13/16 0224 11/14/16 0524 11/14/16 1019  WBC 23.3*  --  20.6* 16.2*  --   CREATININE 2.60*  --  1.56* 1.04* 0.97  LATICACIDVEN  --  0.72  --   --   --     Estimated Creatinine Clearance: 34.4 mL/min (by C-G formula based on SCr of 0.97 mg/dL).    Allergies  Allergen Reactions  . Amoxicillin-Pot Clavulanate Diarrhea  . Cephalexin Other (See Comments)    unknown  . Ciprofloxacin Other (See Comments)  . Levofloxacin Other (See Comments)  . Sulfamethoxazole-Trimethoprim Rash    Antimicrobials this admission: Unasyn 11/12>>11/14 Cefazolin 11/14>>  Dose adjustments this admission: n/a  Microbiology results: 11/12: BCx: ngtd 11/12: UCx: >100K kleb pneumo, R to amp and I to Unasyn  Thank you for allowing pharmacy to be a part of this patient's care.  Marina Desire D. Heliodoro Domagalski, PharmD, BCPS Clinical Pharmacist Clinical Phone for 11/14/2016 until 3:30pm: [phone] If after 3:30pm, please call main pharmacy at [phone] 11/14/2016 11:57 AM

## 2016-11-14 NOTE — [place] FL2 (Signed)
[place] MEDICAID FL2 LEVEL OF CARE SCREENING TOOL     IDENTIFICATION  Patient Name: Viona GilmoreFrances C Santillanes Birthdate: [birth year] Sex: female Admission Date (Current Location): 11/12/2016  [facility] [place]   Facility and Address:  [facility] [place], [address], [place], [place] [address]      Provider Number: [id number]  Attending Physician Name and Address:  Elease EtienneHongalgi, Anand D, MD  Relative Name and Phone Number:  Salem SenateJoseph Hecht - husband; [phone]    Current Level of Care: Hospital Recommended Level of Care: Skilled Nursing Facility Prior Approval Number:    Date Approved/Denied:   PASRR Number: [id number] A(eff. 12/31/13)  Discharge Plan: SNF    Current Diagnoses: Patient Active Problem List   Diagnosis Date Noted  . Pressure injury of skin 11/14/2016  . Metabolic acidosis 11/13/2016  . Hypophosphatemia 11/13/2016  . Hypokalemia 11/13/2016  . Cellulitis of right leg 11/13/2016  . Diarrhea 11/13/2016  . UTI (urinary tract infection) 11/12/2016  . Leukocytosis 11/12/2016  . ARF (acute renal failure) (HCC) 11/12/2016  . Hypoglycemia 11/12/2016  . Open wound of right lower extremity 11/12/2016  . Rash and nonspecific skin eruption 11/12/2016  . Hypochromic microcytic anemia 11/12/2016  . Hyponatremia 12/31/2013  . Low back pain 12/31/2013  . Normocytic normochromic anemia 12/31/2013  . Lung nodule   . Colitis 11/22/2011  . HTN (hypertension) 11/22/2011  . Abdominal pain 11/22/2011    Orientation RESPIRATION BLADDER Height & Weight     Self  Normal Incontinent Weight: 144 lb (65.3 kg) Height:  5\' 3"  (160 cm)  BEHAVIORAL SYMPTOMS/MOOD NEUROLOGICAL BOWEL NUTRITION STATUS      Continent Diet(Soft)  AMBULATORY STATUS COMMUNICATION OF NEEDS Skin   Total Care(Patient did not ambulate with PT during eval on 11/13) Verbally Other (Comment)(Stage 2 pressure injury to medial sacrum; wound right leg)                        Personal Care Assistance Level of Assistance  Bathing, Feeding, Dressing Bathing Assistance: Maximum assistance Feeding assistance: Limited assistance Dressing Assistance: Maximum assistance     Functional Limitations Info  Sight, Hearing, Speech Sight Info: Adequate Hearing Info: Adequate Speech Info: Adequate    SPECIAL CARE FACTORS FREQUENCY  PT (By licensed PT), OT (By licensed OT)     PT Frequency: Evaluation 11/13 OT Frequency: Evaluation 11/13            Contractures Contractures Info: Not present    Additional Factors Info  Code Status, Allergies Code Status Info: Full Allergies Info: Amoxicillin-pot clavulanate; Cephalexin; Ciprofloxacin; Levofloxacin; and Sulfamethoxazole-trimethoprim           Current Medications (11/14/2016):  This is the current hospital active medication list Current Facility-Administered Medications  Medication Dose Route Frequency Provider Last Rate Last Dose  . 0.9 % NaCl with KCl 40 mEq / L  infusion   Intravenous Continuous Marguerita MerlesSheikh, Omair [place] AcresLatif, DO 75 mL/hr at 11/13/16 0836 75 mL/hr at 11/13/16 0836  . acetaminophen (TYLENOL) tablet 650 mg  650 mg Oral Q6H PRN Madelyn FlavorsSmith, Rondell A, MD   650 mg at 11/13/16 2329   Or  . acetaminophen (TYLENOL) suppository 650 mg  650 mg Rectal Q6H PRN Smith, Rondell A, MD      . ceFAZolin (ANCEF) IVPB 1 g/50 mL premix  1 g Intravenous Q12H Bajbus, Lauren D, RPH      . collagenase (SANTYL) ointment   Topical Daily Marguerita MerlesSheikh, Omair DellekerLatif, [place]      .  enoxaparin (LOVENOX) injection 30 mg  30 mg Subcutaneous Q24H Marguerita MerlesSheikh, Omair CrooksLatif, DO   30 mg at 11/13/16 2329  . ipratropium-albuterol (DUONEB) 0.5-2.5 (3) MG/3ML nebulizer solution 3 mL  3 mL Nebulization Q6H PRN Katrinka BlazingSmith, Rondell A, MD      . lactobacillus acidophilus (BACID) tablet 2 tablet  2 tablet Oral TID Antony MaduraHumes, Kelly, PA-C   2 tablet at 11/13/16 1653  . MEDLINE mouth rinse  15 mL Mouth Rinse BID Marguerita MerlesSheikh, Omair TrinwayLatif, DO   15 mL at 11/13/16 [id number]  .  ondansetron (ZOFRAN) tablet 4 mg  4 mg Oral Q6H PRN Madelyn FlavorsSmith, Rondell A, MD       Or  . ondansetron (ZOFRAN) injection 4 mg  4 mg Intravenous Q6H PRN Clydie BraunSmith, Rondell A, MD         Discharge Medications: Please see discharge summary for a list of discharge medications.  Relevant Imaging Results:  Relevant Lab Results:   Additional Information ss#[id number]  Cristobal GoldmannCrawford, Hiliana Eilts Bradley, LCSW

## 2016-11-14 NOTE — Clinical Social Work Note (Signed)
Clinical Social Work Assessment  Patient Details  Name: Kimberly Quinn MRN: [id number] Date of Birth: [birth year]  Date of referral:  11/14/16               Reason for consult:  Facility Placement                Permission sought to share information with:  Family Supports Permission granted to share information::  No(Paatient alert to self only)  Name::     Kimberly Quinn  Agency::     Relationship::  Husband  Contact Information:  [phone]  Housing/Transportation Living arrangements for the past 2 months:  Single Family Home Source of Information:  Spouse Patient Interpreter Needed:  None Criminal Activity/Legal Involvement Pertinent to Current Situation/Hospitalization:  No - Comment as needed Significant Relationships:  Adult Children, Spouse, Other Family Members Lives with:  Spouse Do you feel safe going back to the place where you live?  No(Patient primary caretaker and in agreement with SNF for rehab prior to returning home) Need for family participation in patient care:  Yes (Comment)  Care giving concerns:  Mr. Marlaine HindCoyle reported that he is his wife's primary caregiver and he is in agreement with ST rehab before returning home.  Social Worker assessment / plan:  CSW talked with patient's husband, who was standing outside of his wife's room, as a nurse was providing care for patient. CSW looked in on, but did not engage with patient. Mr. Marlaine HindCoyle reported that his wife's birthday was this past Sunday and he could not get her out of the chair, so she was brought to the hospital. CSW advised that patient has been to skilled facilities before, [facility] and he wants her to [facility].  Mr. Marlaine HindCoyle informed CSW that they have 2 sons who live in [place] and [place], [place]. They also have 3 grandchildren who also live in [place].   Employment status:  Retired Health and safety inspectornsurance information:  Managed Medicare(BCBS) PT Recommendations:  Skilled Nursing  Facility Information / Referral to community resources:  Skilled Nursing Facility(SNF list not provided as patient provided facility preference and they will accept patient at discharge)  Patient/Family's Response to care:  Mr.. Marlaine HindCoyle wanted to ensure that his wife be fed her dinner as he wants to get home before it gets dark. CSW assured him that this information would be passed on to the nurse.  Patient/Family's Understanding of and Emotional Response to Diagnosis, Current Treatment, and Prognosis:  Husband appears aware of his wife's medical condition and need for additional medical care and rehab post discharge.  Emotional Assessment Appearance:  Appears stated age Attitude/Demeanor/Rapport:  Unable to Assess(CSW looked in on patient but talked with husband outside of room) Affect (typically observed):  Unable to Assess Orientation:  Oriented to Self Alcohol / Substance use:  Tobacco Use, Alcohol Use, Illicit Drugs(Patient reported that she quit smoking and does not drink or use illicit drugs) Psych involvement (Current and /or in the community):  No (Comment)  Discharge Needs  Concerns to be addressed:  Discharge Planning Concerns Readmission within the last 30 days:  No Current discharge risk:  None Barriers to Discharge:  Insurance Authorization, Other(patient not medically stable for discharge today)   Cristobal GoldmannCrawford, Jacquie Lukes Bradley, LCSW 11/14/2016, 3:31 PM

## 2016-11-14 NOTE — Clinical Social Work Placement (Signed)
   CLINICAL SOCIAL WORK PLACEMENT  NOTE  Date:  11/14/2016  Patient Details  Name: Kimberly GilmoreFrances C Winstead MRN: [id number] Date of Birth: May 28, 1923  Clinical Social Work is seeking post-discharge placement for this patient at the Skilled  Nursing Facility level of care (*CSW will initial, date and re-position this form in  chart as items are completed):  No(Husband provided CSW with facility preference)   Patient/family provided with [facility] Clinical Social Work Department's list of facilities offering this level of care within the geographic area requested by the patient (or if unable, by the patient's family).  Yes   Patient/family informed of their freedom to choose among providers that offer the needed level of care, that participate in Medicare, Medicaid or managed care program needed by the patient, have an available bed and are willing to accept the patient.  No   Patient/family informed of [place]'s ownership interest in [facility] and [facility], as well as of the fact that they are under no obligation to receive care at these facilities.  PASRR submitted to EDS on       PASRR number received on       Existing PASRR number confirmed on 11/14/16     FL2 transmitted to all facilities in geographic area requested by pt/family on 11/14/16     FL2 transmitted to all facilities within larger geographic area on       Patient informed that his/her managed care company has contracts with or will negotiate with certain facilities, including the following:        Yes(CSW provided with facility preference initially by admissions director at [facility])   Patient/family informed of bed offers received.  [facility]     Physician recommends and patient chooses bed at      [facility]  .  Patient to be transferred to facility by       Patient family notified on   of transfer.  Name of family  member notified:        PHYSICIAN       Additional Comment:    _______________________________________________ Cristobal Goldmannrawford, Alyzah Pelly Bradley, LCSW 11/14/2016, 3:48 PM

## 2016-11-14 NOTE — Progress Notes (Signed)
PROGRESS NOTE   Kimberly Quinn  ZOX:[id number]    DOB: 1923/02/19    DOA: 11/12/2016  PCP: Lupita Raider, MD   I have briefly reviewed patients previous medical [facility] in [facility].  Brief Narrative:  83 year old female with PMH of HTN presented to ED after he was unable to get the patient out of her motorized chair after trying for 1.5 hours. She normally gets around with a motorized chair but is usually able to assist in transfers and ambulate short distances. Associated decreased appetite, lethargy and generalized weakness. Chronic right leg wound since July. Admitted for possible UTI and acute kidney injury.   Assessment & Plan:   Principal Problem:   UTI (urinary tract infection) Active Problems:   Leukocytosis   ARF (acute renal failure) (HCC)   Hypoglycemia   Open wound of right lower extremity   Rash and nonspecific skin eruption   Hypochromic microcytic anemia   Metabolic acidosis   Hypophosphatemia   Hypokalemia   Cellulitis of right leg   Diarrhea   Pressure injury of skin   Klebsiella pneumonia acute cystitis Empirically had been started on IV Unasyn given her history of allergies. Urine culture shows resistance to Unasyn. Discussed with pharmacy and changed to IV Ancef. Consider transitioning to oral Keflex at discharge.  Acute kidney injury - Suspected due to dehydration from poor oral intake. Resolved after IV fluids. - Foley catheter placed in ED? Issue with urinary retention. Trial of discontinuing catheter tomorrow and monitor for voiding.  Hypoglycemia - Noted to have blood sugars as low as 58 on admission despite not being on oral hypoglycemic agents or insulin's at home. Treated with D50 2 amps in the ED. - CBGs mostly in the low 70s. Continue to monitor.  Microcytic anemia - Unclear etiology. Iron 14 but ferritin not tested. Stable. Outpatient follow-up.  Right lower extremity chronic wound with? Cellulitis - Seen by wound care team on  11/12 and continue dressings as per their recommendations: Santyl ointment to provide enzymatic debridement of nonviable tissue and home health at discharge to assist with dressing changes.  Leukocytosis Secondary to infections. Improving.  Diarrhea ? Resolved.  Hyperkalemia, mild/hypokalemia - Likely due to over replacement of hypokalemia. Discontinue potassium oral supplements and in IV fluids. Follow BMP in a.m.  Hypophosphatemia - Replace and follow.  Normal gap metabolic acidosis - Likely related to earlier diarrhea. May be related to hyperchloremia. Follow BMP in a.m.  Essential hypertension Uncontrolled. HCTZ on hold due to recent acute kidney injury. When necessary IV hydralazine.   DVT prophylaxis: Lovenox Code Status: Full Family Communication: None at bedside Disposition: DC SNF when medically improved   Consultants:  None   Procedures:  None  Antimicrobials:  IV Unasyn discontinued IV Ancef    Subjective: Seen this morning. Stated that she wanted to urinate although she had a urinary catheter. Mouth dry and requesting water. Pain in the right leg wound. As per RN, Eating better. No diarrhea reported.  ROS: As per RN, no other acute issues reported.  Objective:  Vitals:   11/13/16 1700 11/13/16 2204 11/14/16 0628 11/14/16 0900  BP: (!) 173/58 (!) 177/54 (!) 170/49 (!) 169/43  Pulse: 78 85 94 94  Resp: 19 16 16 18   Temp:  98.3 F (36.8 C) 97.6 F (36.4 C) 98 F (36.7 C)  TempSrc:    Oral  SpO2: 99% 98% 98% 98%  Weight:  65.3 kg (144 lb)    Height:  Examination:  General exam: Elderly female, small built and frail, lying comfortably supine in bed. Respiratory system: Clear to auscultation. Respiratory effort normal. Cardiovascular system: S1 & S2 heard, RRR. No JVD, murmurs, rubs, gallops or clicks. No pedal edema. Telemetry: Sinus rhythm with occasional PACs. Gastrointestinal system: Abdomen is nondistended, soft and nontender. No  organomegaly or masses felt. Normal bowel sounds heard. Central nervous system: Alert and oriented 2. No focal neurological deficits. Extremities: Symmetric 5 x 5 power. Skin: Chronic right lower leg wound noted with medicated dressing. No significant rash surrounding it. Psychiatry: Judgement and insight appear normal. Mood & affect appropriate.     Data Reviewed: I have personally reviewed following labs and imaging studies  CBC: Recent Labs  Lab 11/12/16 0150 11/13/16 0224 11/14/16 0524  WBC 23.3* 20.6* 16.2*  NEUTROABS  --  17.9* 13.7*  HGB 9.9* 9.0* 9.6*  HCT 30.7* 27.1* 30.1*  MCV 75.6* 75.7* 75.1*  PLT 511* 449* 530*   Basic Metabolic Panel: Recent Labs  Lab 11/12/16 0150 11/13/16 0224 11/14/16 0524 11/14/16 1019  NA 136 135 141 141  K 3.7 2.5* 5.2* 5.3*  CL 107 111 118* 122*  CO2 18* 17* 16* 16*  GLUCOSE 58* 130* 76 87  BUN 97* 70* 39* 33*  CREATININE 2.60* 1.56* 1.04* 0.97  CALCIUM 8.5* 7.7* 8.3* 8.3*  MG  --  1.8 1.7  --   PHOS  --  2.3* 1.3*  --    Liver Function Tests: Recent Labs  Lab 11/12/16 0150 11/13/16 0224 11/14/16 0524  AST 29 27 29   ALT 24 23 26   ALKPHOS 133* 124 147*  BILITOT 0.8 0.7 0.9  PROT 6.5 5.2* 5.8*  ALBUMIN 2.5* 1.9* 2.1*   CBG: Recent Labs  Lab 11/14/16 0615 11/14/16 0806 11/14/16 0848 11/14/16 1138 11/14/16 1745  GLUCAP 75 68 72 75 70    Recent Results (from the past 240 hour(s))  Urine culture     Status: Abnormal   Collection Time: 11/12/16  2:48 AM  Result Value Ref Range Status   Specimen Description URINE, CATHETERIZED  Final   Special Requests ADDED 0614  Final   Culture >=100,000 COLONIES/mL KLEBSIELLA PNEUMONIAE (A)  Final   Report Status 11/14/2016 FINAL  Final   Organism ID, Bacteria KLEBSIELLA PNEUMONIAE (A)  Final      Susceptibility   Klebsiella pneumoniae - MIC*    AMPICILLIN >=32 RESISTANT Resistant     CEFAZOLIN <=4 SENSITIVE Sensitive     CEFTRIAXONE <=1 SENSITIVE Sensitive      CIPROFLOXACIN <=0.25 SENSITIVE Sensitive     GENTAMICIN <=1 SENSITIVE Sensitive     IMIPENEM <=0.25 SENSITIVE Sensitive     NITROFURANTOIN 32 SENSITIVE Sensitive     TRIMETH/SULFA 40 SENSITIVE Sensitive     AMPICILLIN/SULBACTAM 16 INTERMEDIATE Intermediate     PIP/TAZO 16 SENSITIVE Sensitive     Extended ESBL NEGATIVE Sensitive     * >=100,000 COLONIES/mL KLEBSIELLA PNEUMONIAE  Blood culture (routine x 2)     Status: None (Preliminary result)   Collection Time: 11/12/16  5:20 AM  Result Value Ref Range Status   Specimen Description BLOOD RIGHT ANTECUBITAL  Final   Special Requests IN PEDIATRIC BOTTLE Blood Culture adequate volume  Final   Culture NO GROWTH 2 DAYS  Final   Report Status PENDING  Incomplete  Blood culture (routine x 2)     Status: None (Preliminary result)   Collection Time: 11/12/16  5:25 AM  Result Value Ref Range Status  Specimen Description BLOOD RIGHT HAND  Final   Special Requests IN PEDIATRIC BOTTLE Blood Culture adequate volume  Final   Culture NO GROWTH 2 DAYS  Final   Report Status PENDING  Incomplete  MRSA PCR Screening     Status: None   Collection Time: 11/12/16  6:48 AM  Result Value Ref Range Status   MRSA by PCR NEGATIVE NEGATIVE Final    Comment:        The GeneXpert MRSA Assay (FDA approved for NASAL specimens only), is one component of a comprehensive MRSA colonization surveillance program. It is not intended to diagnose MRSA infection nor to guide or monitor treatment for MRSA infections.          Radiology Studies: No results found.      Scheduled Meds: . collagenase   Topical Daily  . enoxaparin (LOVENOX) injection  40 mg Subcutaneous Q24H  . lactobacillus acidophilus  2 tablet Oral TID  . mouth rinse  15 mL Mouth Rinse BID   Continuous Infusions: .  ceFAZolin (ANCEF) IV Stopped (11/14/16 1616)     LOS: 2 days     Donabelle Molden, MD, FACP, FHM. [facility] Pager [phone]  If 7PM-7AM, please contact  night-coverage [email or web address] Password [email or web address] 11/14/2016, 6:04 PM

## 2016-11-15 DIAGNOSIS — S81801D Unspecified open wound, right lower leg, subsequent encounter: Secondary | ICD-10-CM

## 2016-11-15 LAB — PHOSPHORUS: PHOSPHORUS: 3 mg/dL (ref 2.5–4.6)

## 2016-11-15 LAB — CBC
HCT: 30.4 % — ABNORMAL LOW (ref 36.0–46.0)
Hemoglobin: 9.8 g/dL — ABNORMAL LOW (ref 12.0–15.0)
MCH: 24.4 pg — AB (ref 26.0–34.0)
MCHC: 32.2 g/dL (ref 30.0–36.0)
MCV: 75.6 fL — ABNORMAL LOW (ref 78.0–100.0)
PLATELETS: 488 10*3/uL — AB (ref 150–400)
RBC: 4.02 MIL/uL (ref 3.87–5.11)
RDW: 18.2 % — ABNORMAL HIGH (ref 11.5–15.5)
WBC: 16.2 10*3/uL — ABNORMAL HIGH (ref 4.0–10.5)

## 2016-11-15 LAB — BASIC METABOLIC PANEL
Anion gap: 7 (ref 5–15)
BUN: 19 mg/dL (ref 6–20)
CALCIUM: 8.1 mg/dL — AB (ref 8.9–10.3)
CO2: 17 mmol/L — ABNORMAL LOW (ref 22–32)
CREATININE: 0.78 mg/dL (ref 0.44–1.00)
Chloride: 118 mmol/L — ABNORMAL HIGH (ref 101–111)
GFR calc Af Amer: >60 mL/min (ref >60)
GLUCOSE: 78 mg/dL (ref 65–99)
Potassium: 3.6 mmol/L (ref 3.5–5.1)
SODIUM: 142 mmol/L (ref 135–145)

## 2016-11-15 LAB — GLUCOSE, CAPILLARY
GLUCOSE-CAPILLARY: 73 mg/dL (ref 65–99)
GLUCOSE-CAPILLARY: 87 mg/dL (ref 65–99)
Glucose-Capillary: 74 mg/dL (ref 65–99)
Glucose-Capillary: 72 mg/dL (ref 65–99)
Glucose-Capillary: 83 mg/dL (ref 65–99)
Glucose-Capillary: 80 mg/dL (ref 65–99)

## 2016-11-15 NOTE — Progress Notes (Signed)
PROGRESS NOTE   Kimberly Quinn  ZOX:[id number]    DOB: [birth year]    DOA: 11/12/2016  PCP: Lupita Raider, MD   I have briefly reviewed patients previous medical records in [facility].  Brief Narrative:  83 year old female with PMH of HTN presented to ED after he was unable to get the patient out of her motorized chair after trying for 1.5 hours. She normally gets around with a motorized chair but is usually able to assist in transfers and ambulate short distances. Associated decreased appetite, lethargy and generalized weakness. Chronic right leg wound since July. Admitted for possible UTI and acute kidney injury.   Assessment & Plan:   Principal Problem:   UTI (urinary tract infection) Active Problems:   Leukocytosis   ARF (acute renal failure) (HCC)   Hypoglycemia   Open wound of right lower extremity   Rash and nonspecific skin eruption   Hypochromic microcytic anemia   Metabolic acidosis   Hypophosphatemia   Hypokalemia   Cellulitis of right leg   Diarrhea   Pressure injury of skin   Klebsiella pneumonia acute cystitis Empirically had been started on IV Unasyn given her history of allergies. Urine culture showed resistance to Unasyn. Discussed with pharmacy and changed to IV Ancef. Transition to oral Keflex 500 MG twice a day at discharge.  Acute kidney injury - Suspected due to dehydration from poor oral intake. Resolved after IV fluids. - Foley catheter placed in ED? Issue with urinary retention. - Discontinued Foley catheter on 11/15 and monitor for voiding. Discussed with RN.  Hypoglycemia - Noted to have blood sugars as low as 58 on admission despite not being on oral hypoglycemic agents or insulin's at home. Treated with D50 2 amps in the ED. - CBGs mostly in the low 70s. Continue to monitor. - No further hypoglycemic episodes noted. Encourage oral intake.  Microcytic anemia - Unclear etiology. Iron 14 but ferritin not tested. Stable. Outpatient  follow-up.  Right lower extremity chronic wound with? Cellulitis - Seen by wound care team on 11/12 and continue dressings as per their recommendations: Santyl ointment to provide enzymatic debridement of nonviable tissue and home health at discharge to assist with dressing changes. - Examined on 11/15 and much improved.  Leukocytosis Secondary to infections. Improving.  Diarrhea Resolved  Hyperkalemia, mild/hypokalemia - Potassium normal today.  Hypophosphatemia - Replaced.  Normal gap metabolic acidosis - Likely related to earlier diarrhea. May be related to hyperchloremia. Slightly better. Follow BMP in a.m.  Essential hypertension Mildly uncontrolled. HCTZ on hold due to recent acute kidney injury. When necessary IV hydralazine.   DVT prophylaxis: Lovenox Code Status: Full Family Communication: None at bedside Disposition: DC SNF when medically improved. Patient medically stable for discharge but as per discussion with clinical social worker, SNF unable to accept patient until 11/16.   Consultants:  None   Procedures:  None  Antimicrobials:  IV Unasyn discontinued IV Ancef    Subjective: Foley catheter removed. Denies dysuria or pain. Does report mild pain in right ankle wound but indicates that it appears much improved compared to admission. No diarrhea. As per RN, no acute issues.  ROS: Negative.  Objective:  Vitals:   11/14/16 1700 11/14/16 2131 11/15/16 0428 11/15/16 0900  BP: 124/67 (!) 150/66 (!) 144/41 (!) 159/78  Pulse: 73 92 88 82  Resp: 18 17 16 18   Temp: 98.4 F (36.9 C) 98.4 F (36.9 C) 98.4 F (36.9 C) 98.6 F (37 C)  TempSrc: Oral Oral Oral  Oral  SpO2: 98% 98% 99% 98%  Weight:  73 kg (161 lb)    Height:        Examination:  General exam: Elderly female, small built and frail, sitting up comfortably in chair this morning. Respiratory system: Clear to auscultation. Respiratory effort normal. Stable. Cardiovascular system: S1 & S2  heard, RRR. No JVD, murmurs, rubs, gallops or clicks. No pedal edema. Telemetry: Sinus rhythm. Gastrointestinal system: Abdomen is nondistended, soft and nontender. No organomegaly or masses felt. Normal bowel sounds heard. Stable. Central nervous system: Alert and oriented 2. No focal neurological deficits. Stable. Extremities: Symmetric 5 x 5 power. Skin: Chronic right lower leg/ankle wound: Examined today with dressing removed and cleaning up off the Santyl. Looks much improved without redness, warmth or drainage. Psychiatry: Judgement and insight appear impaired. Mood & affect appropriate.     Data Reviewed: I have personally reviewed following labs and imaging studies  CBC: Recent Labs  Lab 11/12/16 0150 11/13/16 0224 11/14/16 0524  WBC 23.3* 20.6* 16.2*  NEUTROABS  --  17.9* 13.7*  HGB 9.9* 9.0* 9.6*  HCT 30.7* 27.1* 30.1*  MCV 75.6* 75.7* 75.1*  PLT 511* 449* 530*   Basic Metabolic Panel: Recent Labs  Lab 11/12/16 0150 11/13/16 0224 11/14/16 0524 11/14/16 1019 11/15/16 0705  NA 136 135 141 141 142  K 3.7 2.5* 5.2* 5.3* 3.6  CL 107 111 118* 122* 118*  CO2 18* 17* 16* 16* 17*  GLUCOSE 58* 130* 76 87 78  BUN 97* 70* 39* 33* 19  CREATININE 2.60* 1.56* 1.04* 0.97 0.78  CALCIUM 8.5* 7.7* 8.3* 8.3* 8.1*  MG  --  1.8 1.7  --   --   PHOS  --  2.3* 1.3*  --  3.0   Liver Function Tests: Recent Labs  Lab 11/12/16 0150 11/13/16 0224 11/14/16 0524  AST 29 27 29   ALT 24 23 26   ALKPHOS 133* 124 147*  BILITOT 0.8 0.7 0.9  PROT 6.5 5.2* 5.8*  ALBUMIN 2.5* 1.9* 2.1*   CBG: Recent Labs  Lab 11/14/16 2046 11/15/16 0036 11/15/16 0427 11/15/16 0805 11/15/16 1147  GLUCAP 72 87 74 72 83    Recent Results (from the past 240 hour(s))  Urine culture     Status: Abnormal   Collection Time: 11/12/16  2:48 AM  Result Value Ref Range Status   Specimen Description URINE, CATHETERIZED  Final   Special Requests ADDED 0614  Final   Culture >=100,000 COLONIES/mL KLEBSIELLA  PNEUMONIAE (A)  Final   Report Status 11/14/2016 FINAL  Final   Organism ID, Bacteria KLEBSIELLA PNEUMONIAE (A)  Final      Susceptibility   Klebsiella pneumoniae - MIC*    AMPICILLIN >=32 RESISTANT Resistant     CEFAZOLIN <=4 SENSITIVE Sensitive     CEFTRIAXONE <=1 SENSITIVE Sensitive     CIPROFLOXACIN <=0.25 SENSITIVE Sensitive     GENTAMICIN <=1 SENSITIVE Sensitive     IMIPENEM <=0.25 SENSITIVE Sensitive     NITROFURANTOIN 32 SENSITIVE Sensitive     TRIMETH/SULFA 40 SENSITIVE Sensitive     AMPICILLIN/SULBACTAM 16 INTERMEDIATE Intermediate     PIP/TAZO 16 SENSITIVE Sensitive     Extended ESBL NEGATIVE Sensitive     * >=100,000 COLONIES/mL KLEBSIELLA PNEUMONIAE  Blood culture (routine x 2)     Status: None (Preliminary result)   Collection Time: 11/12/16  5:20 AM  Result Value Ref Range Status   Specimen Description BLOOD RIGHT ANTECUBITAL  Final   Special Requests IN PEDIATRIC BOTTLE  Blood Culture adequate volume  Final   Culture NO GROWTH 3 DAYS  Final   Report Status PENDING  Incomplete  Blood culture (routine x 2)     Status: None (Preliminary result)   Collection Time: 11/12/16  5:25 AM  Result Value Ref Range Status   Specimen Description BLOOD RIGHT HAND  Final   Special Requests IN PEDIATRIC BOTTLE Blood Culture adequate volume  Final   Culture NO GROWTH 3 DAYS  Final   Report Status PENDING  Incomplete  MRSA PCR Screening     Status: None   Collection Time: 11/12/16  6:48 AM  Result Value Ref Range Status   MRSA by PCR NEGATIVE NEGATIVE Final    Comment:        The GeneXpert MRSA Assay (FDA approved for NASAL specimens only), is one component of a comprehensive MRSA colonization surveillance program. It is not intended to diagnose MRSA infection nor to guide or monitor treatment for MRSA infections.          Radiology Studies: No results found.      Scheduled Meds: . collagenase   Topical Daily  . enoxaparin (LOVENOX) injection  40 mg  Subcutaneous Q24H  . lactobacillus acidophilus  2 tablet Oral TID  . mouth rinse  15 mL Mouth Rinse BID   Continuous Infusions: .  ceFAZolin (ANCEF) IV Stopped (11/15/16 1133)     LOS: 3 days     Marcellus ScottAnand Hongalgi, MD, FACP, [facility]. [facility] Pager [phone]  If 7PM-7AM, please contact night-coverage [email or web address] Password [email or web address] 11/15/2016, 3:34 PM

## 2016-11-15 NOTE — Plan of Care (Signed)
  Acute Rehab PT Goals(only PT should resolve) Pt Will Go Supine/Side To Sit 11/15/2016 1309 - Progressing by Berline LopesWhite, Ivyonna Hoelzel F, PT Patient Will Transfer Sit To/From Stand 11/15/2016 1309 - Progressing by Berline LopesWhite, Iesha Summerhill F, PT Pt Will Transfer Bed To Chair/Chair To Bed 11/15/2016 1309 - Progressing by Berline LopesWhite, Ailin Rochford F, PT Pt Will Ambulate 11/15/2016 1309 - Not Progressing by Berline LopesWhite, Conception Doebler F, PT  [facility] HospitalDawn Cherylyn Sundby,PT Acute Rehabilitation [phone] (pager)

## 2016-11-15 NOTE — Clinical Social Work Note (Signed)
Received call from Chip with [employer] providing CSW with authorization information. Auth #[id number], good for 3 days. Next review date is 11/17/16, RUG level is RVC and 558 therapy minutes per week approved. Chip's contact number if needed is [phone], extension [phone].

## 2016-11-15 NOTE — Progress Notes (Signed)
Occupational Therapy Treatment Patient Details Name: Kimberly GilmoreFrances C Casso MRN: [id number] DOB: 03-05-1923 Today's Date: 11/15/2016    History of present illness Pt is a 83 y.o. female admitted on 11/12/16 with increased lethargy and generalized weakness, unable to get out of her w/c; pt worked up for UTI, AKI and hypoglycemia. Pt also found to have [facility] Only PMH on file includes HTN, spinal stenosis, knee surgery (unspecified).    OT comments  This 83 yo female admitted for above presents to acute OT today at same level for overall bed mobility and toliet transfers as on eval as relates to basic self care. Pt limited by pain and continued decreased balance.   Follow Up Recommendations  SNF;Supervision/Assistance - 24 hour    Equipment Recommendations  Other (comment)(TBD at next venue)       Precautions / Restrictions Precautions Precautions: None Restrictions Weight Bearing Restrictions: No       Mobility Bed Mobility Overal bed mobility: Needs Assistance Bed Mobility: Rolling Rolling: Min assist(to fully complete rolling left and right + VCs multiple times for incontience of bowel x2)   Supine to sit: Max assist;+2 for physical assistance     General bed mobility comments: Min A for legs +2 Mod A for trunk and to scoot hips forward to EOB. Pt sore all over where she touch her so increased time and care needed for all mobility  Transfers Overall transfer level: Needs assistance   Transfers: Squat Pivot Transfers     Squat pivot transfers: Max assist;+2 physical assistance     General transfer comment: Attempted sit<>stand from bed with +2 A with use of gait belt and pad, but pt could not achieve upright standing and was c/o pain from tight gait belt. Squat pivot +2 Max A with gait belt and pad from bed>recliner going to pt's right    Balance Overall balance assessment: Needs assistance Sitting-balance support: Bilateral upper extremity  supported;Feet supported Sitting balance-Leahy Scale: Poor Sitting balance - Comments: Pt initially with posterior lean, then able to get balance with VCs to remain leaning forward   Standing balance support: Bilateral upper extremity supported;During functional activity Standing balance-Leahy Scale: Poor Standing balance comment: Reliant on BUE support and external assist                           ADL either performed or assessed with clinical judgement   ADL Overall ADL's : Needs assistance/impaired                         Toilet Transfer: Maximal assistance;Stand-pivot;+2 for physical assistance Toilet Transfer Details (indicate cue type and reason): bed>recliner going to pt's right Toileting- Clothing Manipulation and Hygiene: Total assistance;Bed level Toileting - Clothing Manipulation Details (indicate cue type and reason): pt incontinent of bowel x2             Vision Patient Visual Report: No change from baseline            Cognition Arousal/Alertness: Awake/alert Behavior During Therapy: Anxious(about moving and getting up) Overall Cognitive Status: No family/caregiver present to determine baseline cognitive functioning Area of Impairment: Following commands;Safety/judgement;Problem solving;Attention                   Current Attention Level: Sustained   Following Commands: Follows one step commands with increased time Safety/Judgement: Decreased awareness of safety;Decreased awareness of deficits   Problem Solving:  Requires verbal cues;Difficulty sequencing General Comments: When questioned about coming to hospital due to inablilty to get out of motorized W/C--she reports that was not me. When asked if she walks or uses a W/C she states walks. Per chart pt gets around in motorized W/C with some walking and can A with transfers                   Pertinent Vitals/ Pain       Pain Assessment: 0-10 Pain Score: 8  Pain Location:  RLE to touch and movement Pain Intervention(s): Limited activity within patient's tolerance;Monitored during session;Repositioned         Frequency  Min 2X/week        Progress Toward Goals  OT Goals(current goals can now be found in the care plan section)  Progress towards OT goals: Not progressing toward goals - comment(at same levels as on eval for areas addressed this session---limited by pain and decreased balance)     Plan Discharge plan remains appropriate    Co-evaluation    PT/OT/SLP Co-Evaluation/Treatment: Yes Reason for Co-Treatment: For patient/therapist safety;To address functional/ADL transfers   OT goals addressed during session: ADL's and self-care;Strengthening/ROM      AM-PAC PT "6 Clicks" Daily Activity     Outcome Measure   Help from another person eating meals?: A Lot Help from another person taking care of personal grooming?: A Lot Help from another person toileting, which includes using toliet, bedpan, or urinal?: Total Help from another person bathing (including washing, rinsing, drying)?: A Lot Help from another person to put on and taking off regular upper body clothing?: A Lot Help from another person to put on and taking off regular lower body clothing?: Total 6 Click Score: 10    End of Session Equipment Utilized During Treatment: Gait belt  OT Visit Diagnosis: Other abnormalities of gait and mobility (R26.89);Muscle weakness (generalized) (M62.81);Other symptoms and signs involving cognitive function;Pain Pain - Right/Left: Right Pain - part of body: Leg   Activity Tolerance Patient tolerated treatment well   Patient Left in chair;with call bell/phone within reach;with chair alarm set(RN present getting ready to restart IVs)   Nurse Communication Mobility status;Need for lift equipment(maxi move (also written on board); incontient of bowel x2)        Time: [id number] OT Time Calculation (min): 49 min  Charges: OT General  Charges $OT Visit: 1 Visit OT Treatments $Self Care/Home Management : 23-37 mins  Ignacia PalmaCathy Khalik Pewitt, OTR/L [phone] 11/15/2016

## 2016-11-15 NOTE — Progress Notes (Signed)
Physical Therapy Treatment Patient Details Name: Kimberly Quinn MRN: [id number] DOB: 03/28/1923 Today's Date: 11/15/2016    History of Present Illness Pt is a 83 y.o. female admitted on 11/12/16 with increased lethargy and generalized weakness, unable to get out of her w/c; pt worked up for UTI, AKI and hypoglycemia. Pt also found to have [facility] ulcer [facility] Only PMH on file includes HTN, spinal stenosis, knee surgery (unspecified).     PT Comments    Pt admitted with above diagnosis. Pt currently with functional limitations due to the deficits listed below (see PT Problem List). Pt wasonly able to squat pivot to recliner from bed with max assist of 2 persons using pad.  Frequent loose stools prior to getting OOB.  Nursing made aware of pts' bowel issues.  Needs SNF.  Pt will benefit from skilled PT to increase their independence and safety with mobility to allow discharge to the venue listed below.     Follow Up Recommendations  SNF;Supervision/Assistance - 24 hour(husband requesting [facility])     Equipment Recommendations  Other (comment)(TBD next venue)    Recommendations for Other Services       Precautions / Restrictions Precautions Precautions: Fall Restrictions Weight Bearing Restrictions: No    Mobility  Bed Mobility Overal bed mobility: Needs Assistance Bed Mobility: Rolling Rolling: Min assist(to fully complete rolling left and right + VCs multiple times for incontience of bowel x2)   Supine to sit: Max assist;+2 for physical assistance     General bed mobility comments: Min A for legs +2 Mod A for trunk and to scoot hips forward to EOB. Pt sore all over where she touch her so increased time and care needed for all mobility  Transfers Overall transfer level: Needs assistance Equipment used: 2 person hand held assist Transfers: Squat Pivot Transfers Sit to Stand: Max assist;+2 physical assistance   Squat pivot transfers: Max assist;+2  physical assistance     General transfer comment: Attempted sit<>stand from bed with +2 A with use of gait belt and pad, but pt could not achieve upright standing and was c/o pain from tight gait belt. Squat pivot +2 Max A with gait belt and pad from bed>recliner going to pt's right  Ambulation/Gait                 Stairs            Wheelchair Mobility    Modified Rankin (Stroke Patients Only)       Balance Overall balance assessment: Needs assistance Sitting-balance support: Bilateral upper extremity supported;Feet supported Sitting balance-Leahy Scale: Poor Sitting balance - Comments: Pt initially with posterior lean, then able to get balance with VCs to remain leaning forward   Standing balance support: Bilateral upper extremity supported;During functional activity Standing balance-Leahy Scale: Poor Standing balance comment: Reliant on BUE support and external assist                            Cognition Arousal/Alertness: Awake/alert Behavior During Therapy: Anxious(about moving and getting up) Overall Cognitive Status: No family/caregiver present to determine baseline cognitive functioning Area of Impairment: Following commands;Safety/judgement;Problem solving;Attention                 Orientation Level: Disoriented to;Place;Situation Current Attention Level: Sustained Memory: Decreased short-term memory Following Commands: Follows one step commands with increased time Safety/Judgement: Decreased awareness of safety;Decreased awareness of deficits Awareness: Intellectual Problem Solving: Requires verbal cues;Difficulty  sequencing General Comments: When questioned about coming to hospital due to inablilty to get out of motorized W/C--she reports that was not me. When asked if she walks or uses a W/C she states walks. Per chart pt gets around in motorized W/C with some walking and can A with transfers      Exercises      General Comments  General comments (skin integrity, edema, etc.): VSS throughout session      Pertinent Vitals/Pain Pain Assessment: Faces Pain Score: 8  Faces Pain Scale: Hurts whole lot Pain Location: RLE to touch and movement Pain Descriptors / Indicators: Constant;Discomfort Pain Intervention(s): Limited activity within patient's tolerance;Monitored during session;Repositioned  VSS  Home Living                      Prior Function            PT Goals (current goals can now be found in the care plan section) Progress towards PT goals: Progressing toward goals    Frequency    Min 2X/week      PT Plan Current plan remains appropriate    Co-evaluation PT/OT/SLP Co-Evaluation/Treatment: Yes Reason for Co-Treatment: For patient/therapist safety PT goals addressed during session: Mobility/safety with mobility OT goals addressed during session: ADL's and self-care;Strengthening/ROM      AM-PAC PT "6 Clicks" Daily Activity  Outcome Measure  Difficulty turning over in bed (including adjusting bedclothes, sheets and blankets)?: Unable Difficulty moving from lying on back to sitting on the side of the bed? : Unable Difficulty sitting down on and standing up from a chair with arms (e.g., wheelchair, bedside commode, etc,.)?: Unable Help needed moving to and from a bed to chair (including a wheelchair)?: A Lot Help needed walking in hospital room?: Total Help needed climbing 3-5 steps with a railing? : Total 6 Click Score: 7    End of Session Equipment Utilized During Treatment: Gait belt Activity Tolerance: Patient limited by pain;Patient limited by fatigue Patient left: in chair;with call bell/phone within reach;with chair alarm set Nurse Communication: Mobility status;Need for lift equipment PT Visit Diagnosis: Other abnormalities of gait and mobility (R26.89);Muscle weakness (generalized) (M62.81)     Time: [id number] PT Time Calculation (min) (ACUTE ONLY): 32  min  Charges:  $Therapeutic Activity: 8-22 mins                    G Codes:       Tiyon Sanor,PT Acute Rehabilitation [phone] (pager)    Berline Lopesawn F Hayzel Ruberg 11/15/2016, 1:04 PM

## 2016-11-16 DIAGNOSIS — I10 Essential (primary) hypertension: Secondary | ICD-10-CM

## 2016-11-16 LAB — GLUCOSE, CAPILLARY
GLUCOSE-CAPILLARY: 63 mg/dL — AB (ref 65–99)
GLUCOSE-CAPILLARY: 84 mg/dL (ref 65–99)
Glucose-Capillary: 88 mg/dL (ref 65–99)
Glucose-Capillary: 91 mg/dL (ref 65–99)
Glucose-Capillary: 78 mg/dL (ref 65–99)

## 2016-11-16 LAB — MAGNESIUM: Magnesium: 1.8 mg/dL (ref 1.7–2.4)

## 2016-11-16 LAB — BASIC METABOLIC PANEL
Anion gap: 8 (ref 5–15)
BUN: 14 mg/dL (ref 6–20)
CALCIUM: 8.2 mg/dL — AB (ref 8.9–10.3)
CHLORIDE: 116 mmol/L — AB (ref 101–111)
CO2: 17 mmol/L — ABNORMAL LOW (ref 22–32)
CREATININE: 0.75 mg/dL (ref 0.44–1.00)
GFR calc non Af Amer: >60 mL/min (ref >60)
Glucose, Bld: 68 mg/dL (ref 65–99)
Potassium: 3 mmol/L — ABNORMAL LOW (ref 3.5–5.1)
SODIUM: 141 mmol/L (ref 135–145)

## 2016-11-16 MED ORDER — LOSARTAN POTASSIUM 50 MG PO TABS
50.0000 mg | ORAL_TABLET | Freq: Every day | ORAL | Status: DC
  Administered 2016-11-16: 50 mg via ORAL
  Filled 2016-11-16 (×3): qty 1

## 2016-11-16 MED ORDER — POTASSIUM CHLORIDE CRYS ER 20 MEQ PO TBCR
40.0000 meq | EXTENDED_RELEASE_TABLET | ORAL | Status: AC
  Administered 2016-11-16 (×2): 40 meq via ORAL
  Filled 2016-11-16 (×2): qty 2

## 2016-11-16 MED ORDER — LOPERAMIDE HCL 1 MG/5ML PO LIQD
2.0000 mg | Freq: Two times a day (BID) | ORAL | Status: AC | PRN
  Administered 2016-11-16: 2 mg via ORAL
  Filled 2016-11-16 (×2): qty 10

## 2016-11-16 NOTE — Progress Notes (Signed)
PROGRESS NOTE   Kimberly Quinn  ZOX:[id number]    DOB: October 12, 1923    DOA: 11/12/2016  PCP: Lupita RaiderShaw, [name], MD   I have briefly reviewed patients previous medical records in [facility].  Brief Narrative:  83 year old female with PMH of HTN presented to ED after he was unable to get the patient out of her motorized chair after trying for 1.5 hours. She normally gets around with a motorized chair but is usually able to assist in transfers and ambulate short distances. Associated decreased appetite, lethargy and generalized weakness. Chronic right leg wound since July. Admitted for possible UTI and acute kidney injury. Hospital course complicated by diarrhea.   Assessment & Plan:   Principal Problem:   UTI (urinary tract infection) Active Problems:   Leukocytosis   ARF (acute renal failure) (HCC)   Hypoglycemia   Open wound of right lower extremity   Rash and nonspecific skin eruption   Hypochromic microcytic anemia   Metabolic acidosis   Hypophosphatemia   Hypokalemia   Cellulitis of right leg   Diarrhea   Pressure injury of skin   Klebsiella pneumonia acute cystitis Empirically had been started on IV Unasyn given her history of allergies. Urine culture showed resistance to Unasyn. Discussed with pharmacy and changed to IV Ancef. Transition to oral Keflex 500 MG twice a day at discharge. Complete total 5 days of antibiotics (stop date 11/18/2016)  Acute kidney injury - Suspected due to dehydration from poor oral intake. Resolved after IV fluids. - Foley catheter placed in ED? Issue with urinary retention. - Discontinued Foley catheter on 11/15 and voiding well per RN report.  Hypoglycemia - Noted to have blood sugars as low as 58 on admission despite not being on oral hypoglycemic agents or insulin's at home. Treated with D50 2 amps in the ED. - CBGs mostly in the low 70s. Continue to monitor. - Noted another hypoglycemic episode with CBG 63 on 11/16 morning.  Encourage oral intake. Dietitian consultation. Monitor.  Microcytic anemia - Unclear etiology. Iron 14 but ferritin not tested. Stable. Outpatient follow-up.  Right lower extremity chronic wound with? Cellulitis - Seen by wound care team on 11/12 and continue dressings as per their recommendations: Santyl ointment to provide enzymatic debridement of nonviable tissue and home health at discharge to assist with dressing changes. - Examined on 11/15 and much improved.  Leukocytosis Secondary to infections. Improving. Follow CBC in a.m.  Diarrhea Seemed to have resolved only to recur again. Apparently had 4 loose BMs yesterday. 5 BMs recorded overnight but as per RN, this was not reported out to have this morning. At this time patient has had only 1 BM since this morning. When necessary Imodium and monitor. Low index of suspicion for C. difficile at this time.  Hypokalemia Likely secondary to diarrhea. Replace and follow. Magnesium 1.8.  Hypophosphatemia - Replaced.  Normal gap metabolic acidosis - Likely related to diarrhea. May be related to hyperchloremia. Slightly better. Follow BMP in a.m.  Essential hypertension Mildly uncontrolled. HCTZ on hold due to recent acute kidney injury. Resumed low-dose Cozaar. Monitor. Follow renal functions periodically.   DVT prophylaxis: Lovenox Code Status: Full Family Communication: None at bedside Disposition: DC SNF when medically improved, improvement in diarrhea and correction of electrolytes. Possibly 11/17.   Consultants:  None   Procedures:  None  Antimicrobials:  IV Unasyn discontinued IV Ancef    Subjective: Slightly confused this morning. Denies difficulty urinating or pain during urination. Denies diarrhea. No pain reported.  ROS: Negative.  Objective:  Vitals:   11/15/16 2016 11/16/16 0500 11/16/16 0948 11/16/16 1328  BP: (!) 157/80 (!) 171/78 (!) 151/69 (!) 156/54  Pulse: 78 97 92 83  Resp: 19 17 18 18   Temp:  98.7 F (37.1 C) 97.6 F (36.4 C) 97.9 F (36.6 C) 98.4 F (36.9 C)  TempSrc: Oral Oral Oral Oral  SpO2: 97% 98% 99% 98%  Weight: 74.5 kg (164 lb 3.9 oz)     Height:        Examination:  General exam: Elderly female, small built and frail, lying comfortably in bed. Mucosa moist. Respiratory system: Clear to auscultation. Respiratory effort normal. Stable. Cardiovascular system: S1 & S2 heard, RRR. No JVD, murmurs, rubs, gallops or clicks. No pedal edema. Telemetry: Sinus rhythm. Gastrointestinal system: Abdomen is nondistended, soft and nontender. No organomegaly or masses felt. Normal bowel sounds heard. Stable. Central nervous system: Alert and oriented 2. No focal neurological deficits. Stable. Extremities: Symmetric 5 x 5 power. Skin: Chronic right lower leg/ankle wound: Examined 11/15 with dressing removed and cleaning up off the Santyl. Looks much improved without redness, warmth or drainage. Psychiatry: Judgement and insight appear impaired. Mood & affect appropriate.     Data Reviewed: I have personally reviewed following labs and imaging studies  CBC: Recent Labs  Lab 11/12/16 0150 11/13/16 0224 11/14/16 0524 11/15/16 1550  WBC 23.3* 20.6* 16.2* 16.2*  NEUTROABS  --  17.9* 13.7*  --   HGB 9.9* 9.0* 9.6* 9.8*  HCT 30.7* 27.1* 30.1* 30.4*  MCV 75.6* 75.7* 75.1* 75.6*  PLT 511* 449* 530* 488*   Basic Metabolic Panel: Recent Labs  Lab 11/13/16 0224 11/14/16 0524 11/14/16 1019 11/15/16 0705 11/16/16 0528 11/16/16 0719  NA 135 141 141 142 141  --   K 2.5* 5.2* 5.3* 3.6 3.0*  --   CL 111 118* 122* 118* 116*  --   CO2 17* 16* 16* 17* 17*  --   GLUCOSE 130* 76 87 78 68  --   BUN 70* 39* 33* 19 14  --   CREATININE 1.56* 1.04* 0.97 0.78 0.75  --   CALCIUM 7.7* 8.3* 8.3* 8.1* 8.2*  --   MG 1.8 1.7  --   --   --  1.8  PHOS 2.3* 1.3*  --  3.0  --   --    Liver Function Tests: Recent Labs  Lab 11/12/16 0150 11/13/16 0224 11/14/16 0524  AST 29 27 29   ALT  24 23 26   ALKPHOS 133* 124 147*  BILITOT 0.8 0.7 0.9  PROT 6.5 5.2* 5.8*  ALBUMIN 2.5* 1.9* 2.1*   CBG: Recent Labs  Lab 11/15/16 2012 11/16/16 0003 11/16/16 0602 11/16/16 0757 11/16/16 1132  GLUCAP 80 84 63* 88 91    Recent Results (from the past 240 hour(s))  Urine culture     Status: Abnormal   Collection Time: 11/12/16  2:48 AM  Result Value Ref Range Status   Specimen Description URINE, CATHETERIZED  Final   Special Requests ADDED 0614  Final   Culture >=100,000 COLONIES/mL KLEBSIELLA PNEUMONIAE (A)  Final   Report Status 11/14/2016 FINAL  Final   Organism ID, Bacteria KLEBSIELLA PNEUMONIAE (A)  Final      Susceptibility   Klebsiella pneumoniae - MIC*    AMPICILLIN >=32 RESISTANT Resistant     CEFAZOLIN <=4 SENSITIVE Sensitive     CEFTRIAXONE <=1 SENSITIVE Sensitive     CIPROFLOXACIN <=0.25 SENSITIVE Sensitive     GENTAMICIN <=1 SENSITIVE  Sensitive     IMIPENEM <=0.25 SENSITIVE Sensitive     NITROFURANTOIN 32 SENSITIVE Sensitive     TRIMETH/SULFA 40 SENSITIVE Sensitive     AMPICILLIN/SULBACTAM 16 INTERMEDIATE Intermediate     PIP/TAZO 16 SENSITIVE Sensitive     Extended ESBL NEGATIVE Sensitive     * >=100,000 COLONIES/mL KLEBSIELLA PNEUMONIAE  Blood culture (routine x 2)     Status: None (Preliminary result)   Collection Time: 11/12/16  5:20 AM  Result Value Ref Range Status   Specimen Description BLOOD RIGHT ANTECUBITAL  Final   Special Requests IN PEDIATRIC BOTTLE Blood Culture adequate volume  Final   Culture NO GROWTH 4 DAYS  Final   Report Status PENDING  Incomplete  Blood culture (routine x 2)     Status: None (Preliminary result)   Collection Time: 11/12/16  5:25 AM  Result Value Ref Range Status   Specimen Description BLOOD RIGHT HAND  Final   Special Requests IN PEDIATRIC BOTTLE Blood Culture adequate volume  Final   Culture NO GROWTH 4 DAYS  Final   Report Status PENDING  Incomplete  MRSA PCR Screening     Status: None   Collection Time: 11/12/16   6:48 AM  Result Value Ref Range Status   MRSA by PCR NEGATIVE NEGATIVE Final    Comment:        The GeneXpert MRSA Assay (FDA approved for NASAL specimens only), is one component of a comprehensive MRSA colonization surveillance program. It is not intended to diagnose MRSA infection nor to guide or monitor treatment for MRSA infections.          Radiology Studies: No results found.      Scheduled Meds: . collagenase   Topical Daily  . enoxaparin (LOVENOX) injection  40 mg Subcutaneous Q24H  . lactobacillus acidophilus  2 tablet Oral TID  . losartan  50 mg Oral Daily  . mouth rinse  15 mL Mouth Rinse BID   Continuous Infusions: .  ceFAZolin (ANCEF) IV Stopped (11/16/16 1112)     LOS: 4 days     Marcellus Scott, MD, FACP, [facility]. [facility] Pager [phone]  If 7PM-7AM, please contact night-coverage [email or web address] Password [email or web address] 11/16/2016, 4:31 PM

## 2016-11-16 NOTE — Clinical Social Work Note (Signed)
CSW informed by MD that patient will not d/c today due to bouts of diarrhea. Per MD, patient apparently had 4 loose BM's yesterday. 5 BM's recorded overnight. Facility contacted and advised that patient not discharging today and later today contact made with [employer] rep Chip [phone]. [phone]) regarding patient not discharging today. Per Chip he will update auth to start today, 11/16 and the next review date will be 11/18. Chip also informed CSW that if patient does not discharge over the weekend, CSW will need to complete and submit another [employer] form and call [employer] to cancel previous authorization [phone]).  Whitney with [facility] informed of patient not discharging today. She requested the following information on the discharge summary: 1. Specific dressing for patient's right leg 2. Is there a specific ointment for wound? 3. Frequency of dressing changes MD advised of information requested by facility and that they need it on the d/c summary.  CSW will continue to follow and facilitate [facility] when medically stable.

## 2016-11-16 NOTE — Care Management Important Message (Signed)
Important Message  Patient Details  Name: Kimberly Quinn MRN: [id number] Date of Birth: April 18, 1923   Medicare Important Message Given:  Yes    Kyla BalzarineShealy, Acea Yagi Abena 11/16/2016, 12:22 PM

## 2016-11-16 NOTE — Care Management Note (Signed)
Case Management Note  Patient Details  Name: Kimberly GilmoreFrances C Kolodziejski MRN: [id number] Date of Birth: 06-17-1923  CM following for progression and d/c planning.                    Action/Plan: 11/16/2016 Pt for d/c to [facility] , d/c pending improvement in diarrhea.   Expected Discharge Date:    11/17/2016              Expected Discharge Plan:  Skilled Nursing Facility  In-House Referral:  Clinical Social Work  Discharge planning Services  CM Consult  Post Acute Care Choice:   NA Choice offered to:   NA  DME Arranged:   NA DME Agency:   NA  HH Arranged:   NA HH Agency:   NA  Status of Service:  Completed, signed off  If discussed at [employer] Length of Stay Meetings, dates discussed:    Additional Comments: D/C to SNF.  Lakedra Washington, Annamarie MajorCheryl U, RN 11/16/2016, 4:37 PM

## 2016-11-16 NOTE — Progress Notes (Signed)
Pharmacy Antibiotic Note  Kimberly Quinn is a 83 y.o. female admitted on 11/12/2016 with UTI.  Pharmacy has been consulted for Ancef dosing.  The patient continues on Cefazolin D#3/5 for UTI treatment. Dose remains appropriate and a stop date has been entered for 11/18. If the patient is to be discharged before then - can transition to Keflex 500 mg bid.   Plan: 1. Continue Cefazolin 1g IV every 12 hours 2. Stop date entered for 11/18 3. Pharmacy will sign off as no further dose adjustments expected at this time  Height: 5\' 3"  (160 cm) Weight: 164 lb 3.9 oz (74.5 kg) IBW/kg (Calculated) : 52.4  Temp (24hrs), Avg:98.1 F (36.7 C), Min:97.6 F (36.4 C), Max:98.7 F (37.1 C)  Recent Labs  Lab 11/12/16 0150 11/12/16 0202 11/13/16 0224 11/14/16 0524 11/14/16 1019 11/15/16 0705 11/15/16 1550 11/16/16 0528  WBC 23.3*  --  20.6* 16.2*  --   --  16.2*  --   CREATININE 2.60*  --  1.56* 1.04* 0.97 0.78  --  0.75  LATICACIDVEN  --  0.72  --   --   --   --   --   --     Estimated Creatinine Clearance: 44.3 mL/min (by C-G formula based on SCr of 0.75 mg/dL).    Allergies  Allergen Reactions  . Amoxicillin-Pot Clavulanate Diarrhea  . Cephalexin Other (See Comments)    unknown  . Ciprofloxacin Other (See Comments)  . Levofloxacin Other (See Comments)  . Sulfamethoxazole-Trimethoprim Rash    Antimicrobials this admission: Unasyn 11/12>>11/14 Cefazolin 11/14>> (11/18)  Dose adjustments this admission: n/a  Microbiology results: 11/12: BCx: ngtd 11/12: UCx: >100K kleb pneumo, R to amp and I to Unasyn  Thank you for allowing pharmacy to be a part of this patient's care.  Georgina PillionElizabeth Santiago Stenzel, PharmD, BCPS Clinical Pharmacist Pager: [phone] Clinical phone for 11/16/2016 from 7a-3:30p: [phone] If after 3:30p, please call main pharmacy at: [phone] 11/16/2016 10:39 AM

## 2016-11-17 DIAGNOSIS — N390 Urinary tract infection, site not specified: Secondary | ICD-10-CM

## 2016-11-17 LAB — CBC
HEMATOCRIT: 30.6 % — AB (ref 36.0–46.0)
HEMOGLOBIN: 9.8 g/dL — AB (ref 12.0–15.0)
MCH: 24.2 pg — ABNORMAL LOW (ref 26.0–34.0)
MCHC: 32 g/dL (ref 30.0–36.0)
MCV: 75.6 fL — ABNORMAL LOW (ref 78.0–100.0)
Platelets: 512 10*3/uL — ABNORMAL HIGH (ref 150–400)
RBC: 4.05 MIL/uL (ref 3.87–5.11)
RDW: 18.6 % — ABNORMAL HIGH (ref 11.5–15.5)
WBC: 19.9 10*3/uL — AB (ref 4.0–10.5)

## 2016-11-17 LAB — CULTURE, BLOOD (ROUTINE X 2)
Culture: NO GROWTH
Culture: NO GROWTH
SPECIAL REQUESTS: ADEQUATE
Special Requests: ADEQUATE

## 2016-11-17 LAB — BASIC METABOLIC PANEL
Anion gap: 8 (ref 5–15)
BUN: 13 mg/dL (ref 6–20)
CALCIUM: 8.2 mg/dL — AB (ref 8.9–10.3)
CO2: 17 mmol/L — ABNORMAL LOW (ref 22–32)
CREATININE: 0.78 mg/dL (ref 0.44–1.00)
Chloride: 115 mmol/L — ABNORMAL HIGH (ref 101–111)
GFR calc non Af Amer: >60 mL/min (ref >60)
Glucose, Bld: 65 mg/dL (ref 65–99)
Potassium: 3.7 mmol/L (ref 3.5–5.1)
SODIUM: 140 mmol/L (ref 135–145)

## 2016-11-17 LAB — GLUCOSE, CAPILLARY
GLUCOSE-CAPILLARY: 72 mg/dL (ref 65–99)
GLUCOSE-CAPILLARY: 75 mg/dL (ref 65–99)
Glucose-Capillary: 67 mg/dL (ref 65–99)
Glucose-Capillary: 73 mg/dL (ref 65–99)
Glucose-Capillary: 79 mg/dL (ref 65–99)
Glucose-Capillary: 83 mg/dL (ref 65–99)

## 2016-11-17 MED ORDER — COLLAGENASE 250 UNIT/GM EX OINT: TOPICAL_OINTMENT | Freq: Every day | CUTANEOUS | 0 refills | Status: AC

## 2016-11-17 MED ORDER — CEPHALEXIN 500 MG PO CAPS: 500.0000 mg | ORAL_CAPSULE | Freq: Two times a day (BID) | ORAL | 0 refills | Status: AC

## 2016-11-17 MED ORDER — ENSURE ENLIVE PO LIQD: 237.0000 mL | Freq: Three times a day (TID) | ORAL | Status: DC

## 2016-11-17 MED ORDER — ADULT MULTIVITAMIN LIQUID CH: 15.0000 mL | Freq: Every day | ORAL | Status: DC

## 2016-11-17 MED ORDER — CEPHALEXIN 500 MG PO CAPS: 500.0000 mg | ORAL_CAPSULE | Freq: Two times a day (BID) | ORAL | Status: DC

## 2016-11-17 MED ORDER — LOSARTAN POTASSIUM 50 MG PO TABS: 50.0000 mg | ORAL_TABLET | Freq: Every day | ORAL | 0 refills | Status: AC

## 2016-11-17 NOTE — Progress Notes (Signed)
Late Entry: CBG 67, gave apple juice. Will recheck in a few minutes.   Larey Dayshristy M Odella Appelhans, RN

## 2016-11-17 NOTE — Clinical Social Work Placement (Signed)
Nurse to call report to [phone], Room 7006 in [place]    CLINICAL SOCIAL WORK PLACEMENT  NOTE  Date:  11/17/2016  Patient Details  Name: Kimberly Quinn MRN: [id number] Date of Birth: Jun 30, 1923  Clinical Social Work is seeking post-discharge placement for this patient at the Skilled  Nursing Facility level of care (*CSW will initial, date and re-position this form in  chart as items are completed):  No(Husband provided CSW with facility preference)   Patient/family provided with [facility] Clinical Social Work Department's list of facilities offering this level of care within the geographic area requested by the patient (or if unable, by the patient's family).  Yes   Patient/family informed of their freedom to choose among providers that offer the needed level of care, that participate in Medicare, Medicaid or managed care program needed by the patient, have an available bed and are willing to accept the patient.  No   Patient/family informed of [facility]'s ownership interest in [facility] and [facility], as well as of the fact that they are under no obligation to receive care at these facilities.  PASRR submitted to EDS on       PASRR number received on       Existing PASRR number confirmed on 11/14/16     FL2 transmitted to all facilities in geographic area requested by pt/family on 11/14/16     FL2 transmitted to all facilities within larger geographic area on       Patient informed that his/her managed care company has contracts with or will negotiate with certain facilities, including the following:        Yes(CSW provided with facility preference initially by admissions director [facility])   Patient/family informed of bed offers received.  [facility]     Physician recommends and patient chooses bed at      [facility] on 11/17/16.  Patient to be transferred to  facility by [facility]     Patient family notified on 11/17/16 of transfer.  Name of family member notified:  Jomarie LongsJoseph     PHYSICIAN       Additional Comment:    _______________________________________________ Baldemar LenisElizabeth M Fernie Grimm, LCSW 11/17/2016, 1:22 PM

## 2016-11-17 NOTE — Progress Notes (Signed)
Initial Nutrition Assessment  DOCUMENTATION CODES:   Not applicable  INTERVENTION:   Ensure Enlive po TID, each supplement provides 350 kcal and 20 grams of protein  MVI  Dysphagia 3 diet   NUTRITION DIAGNOSIS:   Inadequate oral intake related to acute illness as evidenced by meal completion < 25%.  GOAL:   Patient will meet greater than or equal to 90% of their needs  MONITOR:   PO intake, Supplement acceptance, Labs, Weight trends, Skin  REASON FOR ASSESSMENT:   Consult Assessment of nutrition requirement/status  ASSESSMENT:   83 year old female with PMH of HTN presented to ED after he was unable to get the patient out of her motorized chair after trying for 1.5 hours. Admitted for UTI   Met with pt in room today. Pt is a poor historian but reports good appetite pta. Pt is currently eating <25% of meals. Pt with increased estimated needs r/t wound healing. RD will order Ensure and MVI. Pt to discharge today.   Medications reviewed and include: cephalexin, lovenox, bacid, cefazolin  Labs reviewed: Cl 115(H), Ca 8.2(L) Mg 1.8 wnl- 11/16 Wbc- 19.9(H), Hgb 9.8(L), Hct 30.6(L)  Nutrition-Focused physical exam completed. Findings are no fat depletion, severe muscle depletions in calf, and no edema.   Diet Order:  DIET SOFT Room service appropriate? Yes; Fluid consistency: Thin Diet - low sodium heart healthy  EDUCATION NEEDS:   Not appropriate for education at this time  Skin:  Skin Assessment: (Stage II sacrum, wound leg)  Last BM:  11/17- type 6  Height:   Ht Readings from Last 1 Encounters:  11/12/16 '5\' 3"'  (1.6 m)    Weight:   Wt Readings from Last 1 Encounters:  11/16/16 165 lb 5.5 oz (75 kg)    Ideal Body Weight:  52.3 kg  BMI:  Body mass index is 29.29 kg/m.  Estimated Nutritional Needs:   Kcal:  1400-1700kcal/day   Protein:  65-71g/day   Fluid:  >1.4L/day   Koleen Distance MS, RD, LDN Pager #[phone] After Hours Pager:  [phone]

## 2016-11-17 NOTE — Progress Notes (Signed)
Patient discharged to SNF per MD. NSL discontinued with catheter intact. [facility] provided transport. AVS given to [facility]. Report called to [facility]. Bess KindsGWALTNEY, Gurtej Noyola B, RN

## 2016-11-17 NOTE — Progress Notes (Signed)
Late Entry: CBG [address]  Larey Dayshristy M Kelcey Korus, RN

## 2016-11-17 NOTE — Discharge Summary (Signed)
Kimberly Quinn, is a 83 y.o. female  DOB 30-Nov-1923  MRN [id number].  Admission date:  11/12/2016  Admitting Physician  Clydie Braun, MD  Discharge Date:  11/17/2016   Primary MD  Lupita Raider, MD  Recommendations for primary care physician for things to follow:  Check BMP to monitor CO2 level as well as creatinine. Monitor BP and adjust antihypertensive regimen as needed  Admission Diagnosis  Hypoglycemia [E16.2] Acute cystitis without hematuria [N30.00] Generalized weakness [R53.1] AKI (acute kidney injury) (HCC) [N17.9]   Discharge Diagnosis  Hypoglycemia [E16.2] Acute cystitis without hematuria [N30.00] Generalized weakness [R53.1] AKI (acute kidney injury) (HCC) [N17.9]    Principal Problem:   UTI (urinary tract infection) Active Problems:   Leukocytosis   ARF (acute renal failure) (HCC)   Hypoglycemia   Open wound of right lower extremity   Rash and nonspecific skin eruption   Hypochromic microcytic anemia   Metabolic acidosis   Hypophosphatemia   Hypokalemia   Cellulitis of right leg   Diarrhea   Pressure injury of skin      Past Medical History:  Diagnosis Date  . Hypertension     Past Surgical History:  Procedure Laterality Date  . APPENDECTOMY    . KNEE SURGERY    . TONSILLECTOMY         HPI  from the history and physical done on the day of admission:    Kimberly Quinn is a 83 y.o. female with medical history significant of HTN; who presents after husband notes that he was unable to get the patient out of her motorized chair after trying for 1.5 hours. The patient and her husband provide history The patient normally gets around with a motorized chair, but is usually able to assist in transfers and ambulate short distances. He notes that she is not had much of an appetite and has only been eating a few bites of food here and there for the last 3 days.  Patient  reports that she is not hungry and has no appetite.  Husband notes that the patient's been more lethargic, generalized weakness.  Patient denies having any complaints except for right lower extremity wound and pain that has been present since July.  Husband notes that the rash is getting larger, but home health care has been coming out and attending to the wound 3 times a week.  Patient denies having any chest pain, shortness of breath, nausea, vomiting, and diarrhea.   ED Course: On admission into the emergency department patient was noted to be afebrile, pulses 70-81, respirations 13-25, blood pressure 99/37-140/27, and O2 saturations maintained on room air.  Labs revealed WBC 23.3, hemoglobin 9.9, BUN 97, creatinine 2.6, glucose 58.  UA positive for signs of infection.  Patient was given 1 L normal saline IV fluids and Unasyn.  Despite being given 2 A of D50 the patient's blood sugars are still noted to drop as low as 64.       Hospital Course:  83 year old female with PMH  of HTN presented to ED after he was unable to get the patient out of her motorized chair after trying for 1.5 hours. She normally gets around with a motorized chair but is usually able to assist in transfers and ambulate short distances. Associated decreased appetite, lethargy and generalized weakness. Chronic right leg wound since July. Admitted for possible UTI and acute kidney injury. Hospital course complicated by diarrhea.  Klebsiella pneumonia acute cystitis Empirically had been started on IV Unasyn given her history of allergies. Urine culture showed resistance to Unasyn. Discussed with pharmacy and changed to IV Ancef. Transitioned to oral Keflex 500 MG twice a day at discharge.    Acute kidney injury - Suspected due to dehydration from poor oral intake. Resolved after IV fluids. - Foley catheter placed in ED? Issue with urinary retention. - Discontinued Foley catheter on 11/15 and voiding well per RN  report.     Hypoglycemia - Noted to have blood sugars as low as 58 on admission despite not being on oral hypoglycemic agents or insulin's at home. Treated with D50 2 amps in the ED. - CBGs mostly in the low 70s. Continue to monitor. - Noted another hypoglycemic episode with CBG 63 on 11/16 morning. Encourage oral intake. Dietitian consultation. Monitor.  Microcytic anemia - Unclear etiology. Iron 14 but ferritin not tested. Stable. Outpatient follow-up.  Right lower extremity chronic wound with? Cellulitis - Seen by wound care team on 11/12 and continue dressings as per their recommendations: Santyl ointment to provide enzymatic debridement of nonviable tissue and home health at discharge to assist with dressing changes. - Examined on 11/15 and much improved.  Leukocytosis Secondary to infections. Improving. Follow CBC    Diarrhea Seemed to have resolved only to recur again. Apparently had 4 loose BMs yesterday. 5 BMs recorded overnight but as per RN, this was not reported out to have this morning. At this time patient has had only 1 BM since this morning. When necessary Imodium and monitor. Low index of suspicion for C. difficile at this time.  Hypokalemia Likely secondary to diarrhea. Replace and follow. Magnesium 1.8.  Hypophosphatemia - Replaced.  Normal gap metabolic acidosis - Likely related to diarrhea with hyperchloremia. Slightly better. Follow BMP  Essential hypertension Mildly uncontrolled. HCTZ on hold due to recent acute kidney injury. Resumed low-dose Cozaar. Monitor. Follow renal functions periodically.     Discharge Condition: Stable after surgery  Follow UP     Consults obtained -not applicable  Diet and Activity recommendation:  As advised  Discharge Instructions    Discharge Instructions    Call MD for:  difficulty breathing, headache or visual disturbances   Complete by:  As directed    Call MD for:  extreme fatigue   Complete by:   As directed    Call MD for:  hives   Complete by:  As directed    Call MD for:  persistant dizziness or light-headedness   Complete by:  As directed    Call MD for:  persistant nausea and vomiting   Complete by:  As directed    Call MD for:  redness, tenderness, or signs of infection (pain, swelling, redness, odor or green/yellow discharge around incision site)   Complete by:  As directed    Call MD for:  severe uncontrolled pain   Complete by:  As directed    Call MD for:  temperature >100.4   Complete by:  As directed    Diet - low sodium heart healthy  Complete by:  As directed    Increase activity slowly   Complete by:  As directed         Discharge Medications     Allergies as of 11/17/2016      Reactions   Amoxicillin-pot Clavulanate Diarrhea   Cephalexin Other (See Comments)   unknown   Ciprofloxacin Other (See Comments)   Levofloxacin Other (See Comments)   Sulfamethoxazole-trimethoprim Rash      Medication List    STOP taking these medications   CRANBERRY PO   hydrochlorothiazide 25 MG tablet Commonly known as:  HYDRODIURIL     TAKE these medications   acetaminophen 325 MG tablet Commonly known as:  TYLENOL Take 2 tablets (650 mg total) by mouth every 6 (six) hours as needed for mild pain (or Fever >/= 101). What changed:  Another medication with the same name was removed. Continue taking this medication, and follow the directions you see here.   aspirin EC 81 MG tablet Take 81 mg by mouth every other day.   cephALEXin 500 MG capsule Commonly known as:  KEFLEX Take 1 capsule (500 mg total) every 12 (twelve) hours by mouth.   cholecalciferol 1000 units tablet Commonly known as:  VITAMIN D Take 2,000 Units 2 (two) times daily by mouth.   collagenase ointment Commonly known as:  SANTYL Apply daily topically. Start taking on:  11/18/2016   Fish Oil 1200 MG Caps Take 1,200 mg every morning by mouth.   HYDROcodone-acetaminophen 5-325 MG  tablet Commonly known as:  NORCO/VICODIN Take 1-2 tablets by mouth every 12 (twelve) hours as needed for moderate pain.   loratadine 10 MG tablet Commonly known as:  CLARITIN Take 10 mg by mouth daily as needed for allergies. For allergies   losartan 50 MG tablet Commonly known as:  COZAAR Take 1 tablet (50 mg total) daily by mouth. What changed:    medication strength  how much to take   omeprazole 20 MG capsule Commonly known as:  PRILOSEC Take 20 mg daily by mouth.   ondansetron 4 MG tablet Commonly known as:  ZOFRAN Take 1 tablet (4 mg total) by mouth every 6 (six) hours as needed for nausea.   ranitidine 150 MG capsule Commonly known as:  ZANTAC Take 150 mg by mouth 2 (two) times daily.   sertraline 25 MG tablet Commonly known as:  ZOLOFT Take 25 mg daily by mouth.   SYSTANE BALANCE OP Apply 1 drop 4 (four) times daily as needed to eye (dry eyes).   tamsulosin 0.4 MG Caps capsule Commonly known as:  FLOMAX Take 0.4 mg by mouth.       Major procedures and Radiology Reports - PLEASE review detailed and final reports for all details, in brief -     Dg Chest 2 View  Result Date: 11/12/2016 CLINICAL DATA:  83 year old female with altered mental status and weakness. EXAM: CHEST  2 VIEW COMPARISON:  Chest radiograph dated 12/31/2013 FINDINGS: There is no focal consolidation, pleural effusion, or pneumothorax. Stable cardiomegaly. There is atherosclerotic calcification of the aortic arch. Osteopenia with chronic elevation of the shoulders indicative of chronic rotator cuff injury. Apparent focal discontinuity of the right posterior fifth rib may be artifactual. Correlation with clinical exam and point tenderness recommended to exclude an acute fracture. IMPRESSION: 1. No acute cardiopulmonary process. 2. Osteopenia. Apparent discontinuity of the right posterior fifth rib. Clinical correlation is recommended to exclude an acute fracture. Electronically Signed   By: Ceasar Mons.D.  On: 11/12/2016 02:24    Micro Results    Recent Results (from the past 240 hour(s))  Urine culture     Status: Abnormal   Collection Time: 11/12/16  2:48 AM  Result Value Ref Range Status   Specimen Description URINE, CATHETERIZED  Final   Special Requests ADDED 0614  Final   Culture >=100,000 COLONIES/mL KLEBSIELLA PNEUMONIAE (A)  Final   Report Status 11/14/2016 FINAL  Final   Organism ID, Bacteria KLEBSIELLA PNEUMONIAE (A)  Final      Susceptibility   Klebsiella pneumoniae - MIC*    AMPICILLIN >=32 RESISTANT Resistant     CEFAZOLIN <=4 SENSITIVE Sensitive     CEFTRIAXONE <=1 SENSITIVE Sensitive     CIPROFLOXACIN <=0.25 SENSITIVE Sensitive     GENTAMICIN <=1 SENSITIVE Sensitive     IMIPENEM <=0.25 SENSITIVE Sensitive     NITROFURANTOIN 32 SENSITIVE Sensitive     TRIMETH/SULFA 40 SENSITIVE Sensitive     AMPICILLIN/SULBACTAM 16 INTERMEDIATE Intermediate     PIP/TAZO 16 SENSITIVE Sensitive     Extended ESBL NEGATIVE Sensitive     * >=100,000 COLONIES/mL KLEBSIELLA PNEUMONIAE  Blood culture (routine x 2)     Status: None (Preliminary result)   Collection Time: 11/12/16  5:20 AM  Result Value Ref Range Status   Specimen Description BLOOD RIGHT ANTECUBITAL  Final   Special Requests IN PEDIATRIC BOTTLE Blood Culture adequate volume  Final   Culture NO GROWTH 4 DAYS  Final   Report Status PENDING  Incomplete  Blood culture (routine x 2)     Status: None (Preliminary result)   Collection Time: 11/12/16  5:25 AM  Result Value Ref Range Status   Specimen Description BLOOD RIGHT HAND  Final   Special Requests IN PEDIATRIC BOTTLE Blood Culture adequate volume  Final   Culture NO GROWTH 4 DAYS  Final   Report Status PENDING  Incomplete  MRSA PCR Screening     Status: None   Collection Time: 11/12/16  6:48 AM  Result Value Ref Range Status   MRSA by PCR NEGATIVE NEGATIVE Final    Comment:        The GeneXpert MRSA Assay (FDA approved for NASAL specimens only),  is one component of a comprehensive MRSA colonization surveillance program. It is not intended to diagnose MRSA infection nor to guide or monitor treatment for MRSA infections.        Today   Subjective    Kimberly Quinn today has no no fever or chills.  No abdominal pain.  No diarrhea.          Patient has been seen and examined prior to discharge   Objective   Blood pressure (!) 168/68, pulse 89, temperature 98 F (36.7 C), temperature source Oral, resp. rate 16, height 5\' 3"  (1.6 m), weight 75 kg (165 lb 5.5 oz), SpO2 98 %.   Intake/Output Summary (Last 24 hours) at 11/17/2016 1248 Last data filed at 11/17/2016 0900 Gross per 24 hour  Intake 800 ml  Output 0 ml  Net 800 ml    Exam Gen:- Awake, comfortable HEENT:- Centre.AT, mucous membranes moist Neck-Supple Neck,No JVD,  Lungs-clear to auscultation CV- S1, S2 normal Abd-  +ve B.Sounds, Abd Soft, No tenderness,    Extremity/Skin:-Warm, no  cyanosis   Data Review   CBC w Diff:  Lab Results  Component Value Date   WBC 19.9 (H) 11/17/2016   HGB 9.8 (L) 11/17/2016   HCT 30.6 (L) 11/17/2016   PLT 512 (H)  11/17/2016   LYMPHOPCT 7 11/14/2016   MONOPCT 7 11/14/2016   EOSPCT 1 11/14/2016   BASOPCT 0 11/14/2016    CMP:  Lab Results  Component Value Date   NA 140 11/17/2016   K 3.7 11/17/2016   CL 115 (H) 11/17/2016   CO2 17 (L) 11/17/2016   BUN 13 11/17/2016   CREATININE 0.78 11/17/2016   PROT 5.8 (L) 11/14/2016   ALBUMIN 2.1 (L) 11/14/2016   BILITOT 0.9 11/14/2016   ALKPHOS 147 (H) 11/14/2016   AST 29 11/14/2016   ALT 26 11/14/2016  .   Total Discharge time is about 33 minutes  OSEI-BONSU,Payson Crumby M.D on 11/17/2016 at 12:48 PM  [facility]   Office  [phone]  Dragon dictation system was used to create this note, attempts have been made to correct errors, however presence of uncorrected errors is not a reflection quality of care provided

## 2017-01-16 ENCOUNTER — Ambulatory Visit: Payer: Medicare Other | Admitting: Podiatry

## 2020-03-01 DEATH — deceased
